# Patient Record
Sex: Male | Born: 1955 | Race: White | Hispanic: No | Smoking: Never smoker
Health system: Southern US, Community
[De-identification: ages and names within clinical notes are randomized; demographics above are authoritative.]

## PROBLEM LIST (undated history)

## (undated) DIAGNOSIS — E119 Type 2 diabetes mellitus without complications: Secondary | ICD-10-CM

## (undated) DIAGNOSIS — R55 Syncope and collapse: Secondary | ICD-10-CM

## (undated) HISTORY — PX: HERNIA REPAIR: SHX51

## (undated) HISTORY — DX: Syncope and collapse: R55

---
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
## 2010-06-19 ENCOUNTER — Emergency Department: Payer: Self-pay | Admitting: Emergency Medicine

## 2014-06-16 LAB — HEMOGLOBIN A1C: HEMOGLOBIN A1C: 6.9 % — AB (ref 4.0–6.0)

## 2014-07-07 DIAGNOSIS — J3489 Other specified disorders of nose and nasal sinuses: Secondary | ICD-10-CM | POA: Insufficient documentation

## 2014-07-07 DIAGNOSIS — N529 Male erectile dysfunction, unspecified: Secondary | ICD-10-CM | POA: Insufficient documentation

## 2014-07-07 DIAGNOSIS — L309 Dermatitis, unspecified: Secondary | ICD-10-CM | POA: Insufficient documentation

## 2014-07-07 DIAGNOSIS — Z87438 Personal history of other diseases of male genital organs: Secondary | ICD-10-CM | POA: Insufficient documentation

## 2014-07-07 DIAGNOSIS — E119 Type 2 diabetes mellitus without complications: Secondary | ICD-10-CM | POA: Insufficient documentation

## 2014-09-15 ENCOUNTER — Encounter: Payer: Self-pay | Admitting: Family Medicine

## 2014-09-15 ENCOUNTER — Ambulatory Visit (INDEPENDENT_AMBULATORY_CARE_PROVIDER_SITE_OTHER): Payer: BLUE CROSS/BLUE SHIELD | Admitting: Family Medicine

## 2014-09-15 VITALS — BP 120/70 | HR 60 | Temp 98.0°F | Resp 16 | Wt 191.6 lb

## 2014-09-15 DIAGNOSIS — E119 Type 2 diabetes mellitus without complications: Secondary | ICD-10-CM | POA: Diagnosis not present

## 2014-09-15 DIAGNOSIS — N529 Male erectile dysfunction, unspecified: Secondary | ICD-10-CM

## 2014-09-15 DIAGNOSIS — B351 Tinea unguium: Secondary | ICD-10-CM | POA: Diagnosis not present

## 2014-09-15 DIAGNOSIS — N528 Other male erectile dysfunction: Secondary | ICD-10-CM | POA: Diagnosis not present

## 2014-09-15 LAB — POCT GLYCOSYLATED HEMOGLOBIN (HGB A1C)

## 2014-09-15 MED ORDER — SILDENAFIL CITRATE 20 MG PO TABS: ORAL_TABLET | ORAL | Status: DC

## 2014-09-15 NOTE — Progress Notes (Signed)
Subjective:     Patient ID: Alejandro Marquez, male   DOB: 03/24/1955, 59 y.o.   MRN: [id number]  HPI  Chief Complaint  Patient presents with  . Diabetes    patient is present today for 3 month follow up. Last office visit was 06/16/14 HgbA1C was 6.9%, patient advised to continue Glipizide   States he is working in the heat at his jobs and has lost weight as a result. Reports labs this Spring at his workplace.   Review of Systems  Respiratory: Negative for shortness of breath.   Cardiovascular: Negative for chest pain and leg swelling.  Genitourinary:       Wishes to try generic sildenafil for his E.D.  Skin:       Wishes treatment for dystrophic right first toenail. Discussed otc medication. States he did see the dermatologist for removal of benign nasal lesion.       Objective:   Physical Exam  Constitutional: He appears well-developed and well-nourished. No distress.  Lungs: clear Heart: RRR without murmur Lower extremities: no edema; pedal pulses intact, sensation to monofilament intact, no wounds noted.     Assessment:    1.. Type 2 diabetes mellitus without complication - POCT glycosylated hemoglobin (Hb A1C)  2. ED (erectile dysfunction) of organic origin - sildenafil (REVATIO) 20 MG tablet; Take 2-3 tablets daily as needed for erection  Dispense: 12 tablet; Refill: 0  3. Onychomycosis of right great toe     Plan:    Discussed use of otc products for his toenail. Will f/u in 3 months. He is to acquire work labs for my review.

## 2014-09-15 NOTE — Patient Instructions (Signed)
Please obtain labs from your employee health for my review. Try Vick's Vapo Rub or [place] Tea Tree Oil for your nail for 3-6 months.

## 2014-12-20 ENCOUNTER — Other Ambulatory Visit: Payer: Self-pay | Admitting: Family Medicine

## 2014-12-21 ENCOUNTER — Ambulatory Visit (INDEPENDENT_AMBULATORY_CARE_PROVIDER_SITE_OTHER): Payer: BLUE CROSS/BLUE SHIELD | Admitting: Family Medicine

## 2014-12-21 ENCOUNTER — Encounter: Payer: Self-pay | Admitting: Family Medicine

## 2014-12-21 VITALS — BP 140/80 | HR 77 | Temp 98.7°F | Resp 16 | Wt 193.8 lb

## 2014-12-21 DIAGNOSIS — Z13220 Encounter for screening for lipoid disorders: Secondary | ICD-10-CM

## 2014-12-21 DIAGNOSIS — E119 Type 2 diabetes mellitus without complications: Secondary | ICD-10-CM

## 2014-12-21 LAB — POCT GLYCOSYLATED HEMOGLOBIN (HGB A1C): Hemoglobin A1C: 6.8

## 2014-12-21 NOTE — Progress Notes (Signed)
Subjective:     Patient ID: Alejandro BustleDwayne H Koch, male   DOB: [birth year], 57 y.o.   MRN: [id number]  HPI  Chief Complaint  Patient presents with  . Diabetes  States he continues to be careful with dietary choices and keeping his weight stable. Reports compliance with medication.Brings labs from work which reflect fingerstick values.   Review of Systems  Respiratory: Negative for shortness of breath.   Cardiovascular: Negative for chest pain and palpitations.       Objective:   Physical Exam  Constitutional: He appears well-developed and well-nourished.  Cardiovascular: Normal rate and regular rhythm.   Pulmonary/Chest: Breath sounds normal.  Musculoskeletal: He exhibits no edema (of lower extremities).       Assessment:    1. Type 2 diabetes mellitus without complication, without long-term current use of insulin (HCC) - POCT glycosylated hemoglobin (Hb A1C) - Comprehensive metabolic panel  2. Screening for hyperlipidemia - Lipid panel    Plan:    Further f/u pending lab work. If bp still borderline or elevated will start ACE inhibitor.

## 2014-12-21 NOTE — Patient Instructions (Signed)
We will call you with lab results.       

## 2015-01-01 ENCOUNTER — Telehealth: Payer: Self-pay

## 2015-01-01 LAB — LIPID PANEL
CHOLESTEROL TOTAL: 57 mg/dL — AB (ref 100–199)
Chol/HDL Ratio: 2 ratio units (ref 0.0–5.0)
HDL: 29 mg/dL — ABNORMAL LOW (ref >39)
LDL Calculated: 18 mg/dL (ref 0–99)
TRIGLYCERIDES: 49 mg/dL (ref 0–149)
VLDL Cholesterol Cal: 10 mg/dL (ref 5–40)

## 2015-01-01 LAB — COMPREHENSIVE METABOLIC PANEL
ALBUMIN: 4.2 g/dL (ref 3.5–5.5)
ALK PHOS: 65 IU/L (ref 39–117)
ALT: 44 IU/L (ref 0–44)
AST: 28 IU/L (ref 0–40)
Albumin/Globulin Ratio: 1.8 (ref 1.1–2.5)
BILIRUBIN TOTAL: 0.7 mg/dL (ref 0.0–1.2)
BUN / CREAT RATIO: 12 (ref 9–20)
BUN: 9 mg/dL (ref 6–24)
CHLORIDE: 101 mmol/L (ref 97–106)
CO2: 25 mmol/L (ref 18–29)
Calcium: 9.3 mg/dL (ref 8.7–10.2)
Creatinine, Ser: 0.78 mg/dL (ref 0.76–1.27)
GFR calc Af Amer: 116 mL/min/{1.73_m2} (ref >59)
GFR calc non Af Amer: 100 mL/min/{1.73_m2} (ref >59)
GLOBULIN, TOTAL: 2.4 g/dL (ref 1.5–4.5)
Glucose: 165 mg/dL — ABNORMAL HIGH (ref 65–99)
POTASSIUM: 3.9 mmol/L (ref 3.5–5.2)
SODIUM: 140 mmol/L (ref 136–144)
Total Protein: 6.6 g/dL (ref 6.0–8.5)

## 2015-01-01 NOTE — Telephone Encounter (Signed)
Patient has been advised of lab report. KW 

## 2015-01-01 NOTE — Telephone Encounter (Signed)
Pt is returning call.  CB#[phone]

## 2015-01-01 NOTE — Telephone Encounter (Signed)
-----   Message from Robert Anola Gurneyhauvin, [place] sent at 01/01/2015  7:40 AM EST ----- Your labs are ok. Kidney function and cholesterol are excellent.

## 2015-01-01 NOTE — Telephone Encounter (Signed)
LMTCB-KW 

## 2015-03-23 ENCOUNTER — Other Ambulatory Visit: Payer: Self-pay | Admitting: Family Medicine

## 2015-03-23 ENCOUNTER — Ambulatory Visit (INDEPENDENT_AMBULATORY_CARE_PROVIDER_SITE_OTHER): Payer: BLUE CROSS/BLUE SHIELD | Admitting: Family Medicine

## 2015-03-23 ENCOUNTER — Encounter: Payer: Self-pay | Admitting: Family Medicine

## 2015-03-23 VITALS — BP 118/76 | HR 80 | Temp 98.4°F | Resp 16 | Wt 199.2 lb

## 2015-03-23 DIAGNOSIS — E119 Type 2 diabetes mellitus without complications: Secondary | ICD-10-CM

## 2015-03-23 LAB — POCT GLYCOSYLATED HEMOGLOBIN (HGB A1C): Hemoglobin A1C: 8.6

## 2015-03-23 MED ORDER — METFORMIN HCL 500 MG PO TABS: 500.0000 mg | ORAL_TABLET | Freq: Two times a day (BID) | ORAL | Status: DC

## 2015-03-23 NOTE — Progress Notes (Signed)
Subjective:     Patient ID: Alejandro Marquez, male   DOB: [birth year], 60 y.o.   MRN: [id number]  HPI  Chief Complaint  Patient presents with  . Diabetes    Patient is present in office for 3 month follow up, patient last visit was 12/21/14 hemoglobin at visit was 6.8%. Patient reports good compliance, tolerance and symptom control on medication.  He has gained 6#.Attributes it to the winter weather and not being as active.   Review of Systems  Respiratory: Negative for shortness of breath.   Cardiovascular: Negative for chest pain and palpitations.       Objective:   Physical Exam  Constitutional: He appears well-developed and well-nourished. No distress.  Cardiovascular: Normal rate and regular rhythm.   Pulmonary/Chest: Breath sounds normal.  Musculoskeletal: He exhibits no edema (of lower extremities).       Assessment:    1. Type 2 diabetes mellitus without complication, without long-term current use of insulin (HCC) - POCT glycosylated hemoglobin (Hb A1C) - metFORMIN (GLUCOPHAGE) 500 MG tablet; Take 1 tablet (500 mg total) by mouth 2 (two) times daily with a meal.  Dispense: 180 tablet; Refill: 0 - Microalbumin / creatinine urine ratio    Plan:    Discussed continuing glipizide and exercising for 30 minutes daily.

## 2015-03-23 NOTE — Patient Instructions (Signed)
Work on exercise 30 minutes daily.

## 2015-03-25 LAB — MICROALBUMIN / CREATININE URINE RATIO
Creatinine, Urine: 104.7 mg/dL
MICROALB/CREAT RATIO: 4 mg/g creat (ref 0.0–30.0)
Microalbumin, Urine: 4.2 ug/mL

## 2015-03-26 ENCOUNTER — Telehealth: Payer: Self-pay

## 2015-03-26 NOTE — Telephone Encounter (Signed)
LMTCB-KW 

## 2015-03-26 NOTE — Telephone Encounter (Signed)
Patient was advised. KW 

## 2015-03-26 NOTE — Telephone Encounter (Signed)
-----   Message from Anola Gurney, [name] sent at 03/26/2015  7:43 AM EST ----- Urine test ok-no abnormal elevation of protein.

## 2015-03-26 NOTE — Telephone Encounter (Signed)
Pt returned your call.   ° °Thanks teri  °

## 2015-06-09 ENCOUNTER — Other Ambulatory Visit: Payer: Self-pay | Admitting: Family Medicine

## 2015-06-14 ENCOUNTER — Other Ambulatory Visit: Payer: Self-pay | Admitting: Family Medicine

## 2015-06-18 ENCOUNTER — Encounter: Payer: Self-pay | Admitting: Family Medicine

## 2015-06-18 ENCOUNTER — Ambulatory Visit (INDEPENDENT_AMBULATORY_CARE_PROVIDER_SITE_OTHER): Payer: BLUE CROSS/BLUE SHIELD | Admitting: Family Medicine

## 2015-06-18 VITALS — BP 114/74 | HR 64 | Temp 98.8°F | Resp 16 | Wt 194.0 lb

## 2015-06-18 DIAGNOSIS — E119 Type 2 diabetes mellitus without complications: Secondary | ICD-10-CM | POA: Diagnosis not present

## 2015-06-18 LAB — POCT GLYCOSYLATED HEMOGLOBIN (HGB A1C)

## 2015-06-18 NOTE — Progress Notes (Signed)
Subjective:     Patient ID: Alejandro Marquez, male   DOB: Jul 07, 1955, 60 y.o.   MRN: [id number]  HPI  Chief Complaint  Patient presents with  . Diabetes    Patient returns to office today for 3 month follow up. Last office visit was 03/23/15, HgbA1C in offive was 8.6%. Patient was advised to continue Metformn 500mg  BID and Glipizide. Patient reports good compliance and symptom control.   He has also lost 5# since last office visit. Continues to work two jobs.   Review of Systems  Respiratory: Negative for shortness of breath.   Cardiovascular: Negative for chest pain and palpitations.  Gastrointestinal:       Defers colonoscopy       Objective:   Physical Exam  Constitutional: He appears well-developed and well-nourished. No distress.  Cardiovascular: Normal rate and regular rhythm.   Pulmonary/Chest: Breath sounds normal.  Musculoskeletal: He exhibits no edema (of lower extremities).       Assessment:    1. Type 2 diabetes mellitus without complication, without long-term current use of insulin (HCC): controlled - POCT glycosylated hemoglobin (Hb A1C)    Plan:    Encouraged further weight loss. Discussed he may back down to 1/2 glipizide twice daily if he has low sugar spells

## 2015-06-18 NOTE — Patient Instructions (Signed)
Continue both medications for diabetes. Continue to stay active and see if another 5-10% weight loss will allow us to take off medication.

## 2015-09-04 ENCOUNTER — Other Ambulatory Visit: Payer: Self-pay | Admitting: Family Medicine

## 2015-09-09 ENCOUNTER — Other Ambulatory Visit: Payer: Self-pay | Admitting: Family Medicine

## 2015-09-17 ENCOUNTER — Encounter: Payer: Self-pay | Admitting: Family Medicine

## 2015-09-17 ENCOUNTER — Ambulatory Visit (INDEPENDENT_AMBULATORY_CARE_PROVIDER_SITE_OTHER): Payer: BLUE CROSS/BLUE SHIELD | Admitting: Family Medicine

## 2015-09-17 VITALS — BP 122/80 | HR 68 | Temp 98.3°F | Wt 193.2 lb

## 2015-09-17 DIAGNOSIS — E119 Type 2 diabetes mellitus without complications: Secondary | ICD-10-CM | POA: Diagnosis not present

## 2015-09-17 LAB — POCT GLYCOSYLATED HEMOGLOBIN (HGB A1C)

## 2015-09-17 NOTE — Progress Notes (Signed)
Subjective:     Patient ID: Alejandro Marquez, male   DOB: [birth year], 60 y.o.   MRN: [id number]  HPI  Chief Complaint  Patient presents with  . Diabetes    Patient comes into office today for 3 month follow up visit, last offive visit was 06/18/15 condition was stable. Patient last reported HgbA1C was 6.9%, patient reports that he is not checking his blood sugar at home. He reports good compliance and tolerance on medication.      Review of Systems  Respiratory: Negative for shortness of breath.   Cardiovascular: Negative for chest pain and palpitations.  Skin: Positive for rash.       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 Has used corn starch with minimal improvement.       Objective:   Physical Exam  Constitutional: He appears well-developed and well-nourished. No distress.  Lungs: clear Heart: RRR without murmur Lower extremities: no edema; pedal pulses intact, sensation to monofilament intact, no wounds noted.     Assessment:    1. Type 2 diabetes mellitus without complication, without long-term current use of insulin (HCC) - POCT glycosylated hemoglobin (Hb A1C)    Plan:    Discussed use of Tinactin powder or similar. Will continue his current exercise and food choices and not increase medication at this time. Recheck labs next o.v.

## 2015-09-17 NOTE — Patient Instructions (Addendum)
Continue regular exercise and wise food choices. We will update your labs at the next office visit. Discussed use of antifungal powder 1-2 x day for groin rash.

## 2015-09-21 DIAGNOSIS — E119 Type 2 diabetes mellitus without complications: Secondary | ICD-10-CM | POA: Diagnosis not present

## 2015-09-21 DIAGNOSIS — H524 Presbyopia: Secondary | ICD-10-CM | POA: Diagnosis not present

## 2015-11-27 ENCOUNTER — Other Ambulatory Visit: Payer: Self-pay | Admitting: Family Medicine

## 2015-12-01 NOTE — Telephone Encounter (Signed)
Provider is out of office can you please review over request? Thank you. KW 

## 2015-12-02 ENCOUNTER — Other Ambulatory Visit: Payer: Self-pay | Admitting: Family Medicine

## 2015-12-03 NOTE — Telephone Encounter (Signed)
Provider is out of office can you please review? Thanks KW 

## 2015-12-03 NOTE — Telephone Encounter (Signed)
Will send refill to the pharmacy. Remind patient to keep appointment scheduled for 12-20-15 to follow up on diabetes.

## 2015-12-08 ENCOUNTER — Other Ambulatory Visit: Payer: Self-pay | Admitting: Family Medicine

## 2015-12-08 DIAGNOSIS — Z13220 Encounter for screening for lipoid disorders: Secondary | ICD-10-CM

## 2015-12-08 DIAGNOSIS — E119 Type 2 diabetes mellitus without complications: Secondary | ICD-10-CM | POA: Diagnosis not present

## 2015-12-09 ENCOUNTER — Telehealth: Payer: Self-pay

## 2015-12-09 LAB — COMPREHENSIVE METABOLIC PANEL
ALK PHOS: 69 IU/L (ref 39–117)
ALT: 69 IU/L — ABNORMAL HIGH (ref 0–44)
AST: 52 IU/L — AB (ref 0–40)
Albumin/Globulin Ratio: 1.5 (ref 1.2–2.2)
Albumin: 4.3 g/dL (ref 3.5–5.5)
BUN/Creatinine Ratio: 13 (ref 9–20)
BUN: 11 mg/dL (ref 6–24)
Bilirubin Total: 0.9 mg/dL (ref 0.0–1.2)
CO2: 27 mmol/L (ref 18–29)
CREATININE: 0.85 mg/dL (ref 0.76–1.27)
Calcium: 9.4 mg/dL (ref 8.7–10.2)
Chloride: 99 mmol/L (ref 96–106)
GFR calc Af Amer: 111 mL/min/{1.73_m2} (ref >59)
GFR calc non Af Amer: 96 mL/min/{1.73_m2} (ref >59)
GLOBULIN, TOTAL: 2.9 g/dL (ref 1.5–4.5)
Glucose: 172 mg/dL — ABNORMAL HIGH (ref 65–99)
POTASSIUM: 4.6 mmol/L (ref 3.5–5.2)
SODIUM: 141 mmol/L (ref 134–144)
Total Protein: 7.2 g/dL (ref 6.0–8.5)

## 2015-12-09 LAB — LIPID PANEL
CHOLESTEROL TOTAL: 63 mg/dL — AB (ref 100–199)
Chol/HDL Ratio: 2 ratio units (ref 0.0–5.0)
HDL: 31 mg/dL — ABNORMAL LOW (ref >39)
LDL CALC: 23 mg/dL (ref 0–99)
TRIGLYCERIDES: 44 mg/dL (ref 0–149)
VLDL Cholesterol Cal: 9 mg/dL (ref 5–40)

## 2015-12-09 NOTE — Telephone Encounter (Signed)
Patient was advised he states that he believes he might have ate something that day that might be cause for elevated glucose. KW

## 2015-12-09 NOTE — Telephone Encounter (Signed)
LMTCB-KW 

## 2015-12-09 NOTE — Telephone Encounter (Signed)
-----   Message from Anola Gurneyobert Chauvin, [place] sent at 12/09/2015  7:44 AM EDT ----- Cholesterol remains low. Sugar is elevated (were these fasting labs?) and two of your liver tests are mildly elevated. Will discuss further at your upcoming office visit.

## 2015-12-20 ENCOUNTER — Encounter: Payer: Self-pay | Admitting: Family Medicine

## 2015-12-20 ENCOUNTER — Ambulatory Visit (INDEPENDENT_AMBULATORY_CARE_PROVIDER_SITE_OTHER): Payer: BLUE CROSS/BLUE SHIELD | Admitting: Family Medicine

## 2015-12-20 VITALS — BP 110/76 | HR 80 | Temp 98.6°F | Resp 16 | Wt 197.0 lb

## 2015-12-20 DIAGNOSIS — R7989 Other specified abnormal findings of blood chemistry: Secondary | ICD-10-CM

## 2015-12-20 DIAGNOSIS — R945 Abnormal results of liver function studies: Secondary | ICD-10-CM

## 2015-12-20 DIAGNOSIS — E119 Type 2 diabetes mellitus without complications: Secondary | ICD-10-CM

## 2015-12-20 LAB — POCT GLYCOSYLATED HEMOGLOBIN (HGB A1C): Hemoglobin A1C: 7.7

## 2015-12-20 MED ORDER — GLIPIZIDE 10 MG PO TABS: 10.0000 mg | ORAL_TABLET | Freq: Two times a day (BID) | ORAL | 1 refills | Status: DC

## 2015-12-20 NOTE — Progress Notes (Signed)
Subjective:     Patient ID: Alejandro Marquez, male   DOB: [birth year], 60 y.o.   MRN: [id number]  HPI  Chief Complaint  Patient presents with  . Diabetes    Patient comes in office today afor 3 month follow up, last office visit was 09/17/15 HgbA1C was 7.4%. At last office visit patient was encouraged to exercise and watch food choices. Patient reports that he does not exercise but is active between his two jobs.   Mildly elevated transaminases on his latest met profile. Reports remote history of fatty liver after G.I.evaluation at [facility] per his report. Defers flu and pneumovax vaccines: "I don't like shots."   Review of Systems  Respiratory: Negative for shortness of breath.   Cardiovascular: Negative for chest pain and palpitations.       Objective:   Physical Exam  Constitutional: He appears well-developed and well-nourished. No distress.  Cardiovascular: Normal rate and regular rhythm.   Pulmonary/Chest: Breath sounds normal.  Musculoskeletal: He exhibits no edema (of lower extremities).       Assessment:    1. Type 2 diabetes mellitus without complication, without long-term current use of insulin ([place]): will increase glipizide, continue metformin (can't tolerate higher dose due to diarrhea). - POCT glycosylated hemoglobin (Hb A1C) - glipiZIDE (GLUCOTROL) 10 MG tablet; Take 1 tablet (10 mg total) by mouth 2 (two) times daily before a meal.  Dispense: 180 tablet; Refill: 1  2. Elevated liver function tests - Hepatic function panel    Plan:   Further f/u pending lab work and in 3 months.

## 2015-12-20 NOTE — Patient Instructions (Signed)
Please get your liver tests. And we will call you with the results. Increase your glipizide to 10 mg.

## 2015-12-23 DIAGNOSIS — R7989 Other specified abnormal findings of blood chemistry: Secondary | ICD-10-CM | POA: Diagnosis not present

## 2015-12-24 ENCOUNTER — Telehealth: Payer: Self-pay

## 2015-12-24 LAB — HEPATIC FUNCTION PANEL
ALBUMIN: 4 g/dL (ref 3.5–5.5)
ALT: 73 IU/L — ABNORMAL HIGH (ref 0–44)
AST: 54 IU/L — ABNORMAL HIGH (ref 0–40)
Alkaline Phosphatase: 64 IU/L (ref 39–117)
BILIRUBIN TOTAL: 1.1 mg/dL (ref 0.0–1.2)
BILIRUBIN, DIRECT: 0.35 mg/dL (ref 0.00–0.40)
Total Protein: 7 g/dL (ref 6.0–8.5)

## 2015-12-24 NOTE — Telephone Encounter (Signed)
lmtcb Emily Drozdowski, CMA  

## 2015-12-24 NOTE — Telephone Encounter (Signed)
Patient has been advised he states that he will recheck again in 3months.KW

## 2015-12-24 NOTE — Telephone Encounter (Signed)
-----   Message from Anola Gurneyobert Chauvin, [place] sent at 12/24/2015  7:36 AM EDT ----- Liver tests about the same. Do you wish further evaluation or just recheck labs in 3 months?

## 2015-12-24 NOTE — Telephone Encounter (Signed)
LMTCB-KW 

## 2016-02-27 ENCOUNTER — Other Ambulatory Visit: Payer: Self-pay | Admitting: Family Medicine

## 2016-03-01 DIAGNOSIS — L718 Other rosacea: Secondary | ICD-10-CM | POA: Diagnosis not present

## 2016-03-21 ENCOUNTER — Ambulatory Visit (INDEPENDENT_AMBULATORY_CARE_PROVIDER_SITE_OTHER): Payer: BLUE CROSS/BLUE SHIELD | Admitting: Family Medicine

## 2016-03-21 ENCOUNTER — Encounter: Payer: Self-pay | Admitting: Family Medicine

## 2016-03-21 VITALS — BP 110/70 | HR 80 | Temp 98.8°F | Resp 16 | Wt 197.4 lb

## 2016-03-21 DIAGNOSIS — R740 Nonspecific elevation of levels of transaminase and lactic acid dehydrogenase [LDH]: Secondary | ICD-10-CM | POA: Diagnosis not present

## 2016-03-21 DIAGNOSIS — E119 Type 2 diabetes mellitus without complications: Secondary | ICD-10-CM | POA: Diagnosis not present

## 2016-03-21 DIAGNOSIS — IMO0002 Reserved for concepts with insufficient information to code with codable children: Secondary | ICD-10-CM

## 2016-03-21 LAB — POCT GLYCOSYLATED HEMOGLOBIN (HGB A1C)

## 2016-03-21 MED ORDER — METFORMIN HCL 1000 MG PO TABS: 1000.0000 mg | ORAL_TABLET | Freq: Two times a day (BID) | ORAL | 3 refills | Status: DC

## 2016-03-21 NOTE — Patient Instructions (Signed)
We will call you with the lab results. Let me know if you can't tolerate the higher dose of metformin

## 2016-03-21 NOTE — Progress Notes (Signed)
Subjective:     Patient ID: Alejandro Marquez, male   DOB: September 23, 1955, 61 y.o.   MRN: [id number]  HPI  Chief Complaint  Patient presents with  . Diabetes    Patient comes in office today for 3 month follow up, last office visit was 12/20/15 we increased Glipizide to 10 mg and advised patient to continue Metformin. Patients HgbA1C at visit was 7.7%, patient reports good compliance and tolerance on medication since being increased.   . Abnormal Lab    At last office visit 12/20/15 patient was given order for Hepatic Function Panel, results came back showing elevation of  AST, ALT.   Continues to work two jobs.   Review of Systems  Respiratory: Negative for shortness of breath.   Cardiovascular: Negative for chest pain and palpitations.       Objective:   Physical Exam  Constitutional: He appears well-developed and well-nourished. No distress.  Cardiovascular: Normal rate and regular rhythm.   Pulmonary/Chest: Breath sounds normal.  Musculoskeletal: He exhibits no edema (of lower extremities).       Assessment:    1. Type 2 diabetes mellitus without complication, without long-term current use of insulin (HCC): will increase medication for better control - POCT glycosylated hemoglobin (Hb A1C) - metFORMIN (GLUCOPHAGE) 1000 MG tablet; Take 1 tablet (1,000 mg total) by mouth 2 (two) times daily with a meal.  Dispense: 180 tablet; Refill: 3  2. Transaminase or LDH elevation - Hepatic function panel    Plan:   Further f/u pending lab work

## 2016-05-05 DIAGNOSIS — R740 Nonspecific elevation of levels of transaminase and lactic acid dehydrogenase [LDH]: Secondary | ICD-10-CM | POA: Diagnosis not present

## 2016-05-06 LAB — HEPATIC FUNCTION PANEL
ALK PHOS: 83 IU/L (ref 39–117)
ALT: 70 IU/L — AB (ref 0–44)
AST: 43 IU/L — ABNORMAL HIGH (ref 0–40)
Albumin: 4.2 g/dL (ref 3.5–5.5)
BILIRUBIN, DIRECT: 0.14 mg/dL (ref 0.00–0.40)
Bilirubin Total: 0.4 mg/dL (ref 0.0–1.2)
Total Protein: 7.1 g/dL (ref 6.0–8.5)

## 2016-05-08 ENCOUNTER — Telehealth: Payer: Self-pay

## 2016-05-08 NOTE — Telephone Encounter (Signed)
-----   Message from Anola Gurneyobert Chauvin, [place] sent at 05/08/2016  7:59 AM EDT ----- Lever tests mildly improved. Will  Discuss further at next office visit.

## 2016-05-08 NOTE — Telephone Encounter (Signed)
Patient has been advised. KW 

## 2016-05-08 NOTE — Telephone Encounter (Signed)
LMTCB  Thanks,  -Joseline 

## 2016-06-07 ENCOUNTER — Other Ambulatory Visit: Payer: Self-pay | Admitting: Family Medicine

## 2016-06-07 DIAGNOSIS — E119 Type 2 diabetes mellitus without complications: Secondary | ICD-10-CM

## 2016-06-19 ENCOUNTER — Ambulatory Visit (INDEPENDENT_AMBULATORY_CARE_PROVIDER_SITE_OTHER): Payer: BLUE CROSS/BLUE SHIELD | Admitting: Family Medicine

## 2016-06-19 ENCOUNTER — Encounter: Payer: Self-pay | Admitting: Family Medicine

## 2016-06-19 VITALS — BP 120/84 | HR 92 | Temp 98.5°F | Resp 16 | Wt 192.6 lb

## 2016-06-19 DIAGNOSIS — R7401 Elevation of levels of liver transaminase levels: Secondary | ICD-10-CM | POA: Insufficient documentation

## 2016-06-19 DIAGNOSIS — R740 Nonspecific elevation of levels of transaminase and lactic acid dehydrogenase [LDH]: Secondary | ICD-10-CM | POA: Diagnosis not present

## 2016-06-19 DIAGNOSIS — E119 Type 2 diabetes mellitus without complications: Secondary | ICD-10-CM

## 2016-06-19 LAB — POCT GLYCOSYLATED HEMOGLOBIN (HGB A1C): Hemoglobin A1C: 6

## 2016-06-19 NOTE — Patient Instructions (Addendum)
We will call you with the results of the test for urine protein. We will check your liver tests again at the next visit.

## 2016-06-19 NOTE — Progress Notes (Signed)
Subjective:     Patient ID: Alejandro Marquez, male   DOB: Oct 24, 1955, 61 y.o.   MRN: [id number]  HPI  Chief Complaint  Patient presents with  . Diabetes    Patient comes in office today for three visit, last visit was 03/21/16 we had increased patients Metformin to  twice a day to control diabetes. Patient reports good compliance and tolerance on medication, patient states that he has had 1-2 hyperglycemia incidents since last visit.   Reports that he gets his eye exam annually in August with Dr. Alexia Freestone. Continues to work two jobs and his weight is down 5 # from prior visit.   Review of Systems  Respiratory: Negative for shortness of breath.   Cardiovascular: Negative for chest pain and palpitations.       Objective:   Physical Exam  Constitutional: He appears well-developed and well-nourished. No distress.  Lungs: clear Heart: RRR without murmur Lower extremities: no edema; pedal pulses intact, sensation to monofilament intact, no wounds noted.     Assessment:    1. Type 2 diabetes mellitus without complication, without long-term current use of insulin (HCC) - POCT glycosylated hemoglobin (Hb A1C) - Microalbumin / creatinine urine ratio  2. Elevated transaminase level: recheck at next o.v./Consider ultrasound    Plan:    Further f/u pending lab results.

## 2016-06-20 ENCOUNTER — Telehealth: Payer: Self-pay

## 2016-06-20 LAB — MICROALBUMIN / CREATININE URINE RATIO
CREATININE, UR: 155.4 mg/dL
MICROALB/CREAT RATIO: 3.9 mg/g{creat} (ref 0.0–30.0)
MICROALBUM., U, RANDOM: 6.1 ug/mL

## 2016-06-20 NOTE — Telephone Encounter (Signed)
Patient has been advised. KW 

## 2016-06-20 NOTE — Telephone Encounter (Signed)
-----   Message from Anola Gurney, [name] sent at 06/20/2016  1:46 PM EDT ----- Urine ok

## 2016-09-18 ENCOUNTER — Encounter: Payer: Self-pay | Admitting: Family Medicine

## 2016-09-18 ENCOUNTER — Ambulatory Visit (INDEPENDENT_AMBULATORY_CARE_PROVIDER_SITE_OTHER): Payer: BLUE CROSS/BLUE SHIELD | Admitting: Family Medicine

## 2016-09-18 VITALS — BP 150/100 | HR 83 | Temp 98.8°F | Resp 16 | Wt 190.8 lb

## 2016-09-18 DIAGNOSIS — E119 Type 2 diabetes mellitus without complications: Secondary | ICD-10-CM | POA: Diagnosis not present

## 2016-09-18 DIAGNOSIS — R7401 Elevation of levels of liver transaminase levels: Secondary | ICD-10-CM

## 2016-09-18 DIAGNOSIS — R740 Nonspecific elevation of levels of transaminase and lactic acid dehydrogenase [LDH]: Secondary | ICD-10-CM | POA: Diagnosis not present

## 2016-09-18 LAB — POCT GLYCOSYLATED HEMOGLOBIN (HGB A1C): HEMOGLOBIN A1C: 6.3

## 2016-09-18 NOTE — Patient Instructions (Signed)
Nurse bp check in a week or so. We will call you with the lab results.

## 2016-09-18 NOTE — Progress Notes (Signed)
Subjective:     Patient ID: Alejandro Marquez, male   DOB: [birth year], 61 y.o.   MRN: [id number]  HPI  Chief Complaint  Patient presents with  . Diabetes    Patient returns back to office today for 3 month follow up, last office visit was 6.0%. Patient reports good compliance and symptom control on medication.   States he ate [place] sausages prior to his office visit and feels that may have contributed to his blood pressure elevation.   Review of Systems  Respiratory: Negative for shortness of breath.   Cardiovascular: Negative for chest pain and palpitations.       Objective:   Physical Exam  Constitutional: He appears well-developed and well-nourished. No distress.  Cardiovascular: Normal rate and regular rhythm.   Pulmonary/Chest: Breath sounds normal.  Musculoskeletal: He exhibits no edema (of lower extremities).       Assessment:    1. Type 2 diabetes mellitus without complication, without long-term current use of insulin (HCC) - POCT glycosylated hemoglobin (Hb A1C)  2. Elevated transaminase level - Hepatic function panel - Hepatitis C Antibody    Plan:    Nurse bp check in a week. Further f/u pending lab work. Consider abdominal ultrasound.

## 2016-09-19 ENCOUNTER — Telehealth: Payer: Self-pay

## 2016-09-19 LAB — HEPATITIS C ANTIBODY

## 2016-09-19 LAB — HEPATIC FUNCTION PANEL
ALT: 57 IU/L — ABNORMAL HIGH (ref 0–44)
AST: 39 IU/L (ref 0–40)
Albumin: 4.3 g/dL (ref 3.5–5.5)
Alkaline Phosphatase: 66 IU/L (ref 39–117)
Bilirubin Total: 0.7 mg/dL (ref 0.0–1.2)
Bilirubin, Direct: 0.31 mg/dL (ref 0.00–0.40)
Total Protein: 7.1 g/dL (ref 6.0–8.5)

## 2016-09-19 NOTE — Telephone Encounter (Signed)
lmtcb

## 2016-09-19 NOTE — Telephone Encounter (Signed)
-----   Message from Anola Gurneyobert Chauvin, [place] sent at 09/19/2016  7:31 AM EDT ----- Hepatitis C ok; one liver test is now normal the other is near normal.

## 2016-09-20 NOTE — Telephone Encounter (Signed)
Pt advised.

## 2016-09-21 DIAGNOSIS — E119 Type 2 diabetes mellitus without complications: Secondary | ICD-10-CM | POA: Diagnosis not present

## 2016-09-21 DIAGNOSIS — H524 Presbyopia: Secondary | ICD-10-CM | POA: Diagnosis not present

## 2016-12-05 ENCOUNTER — Other Ambulatory Visit: Payer: Self-pay | Admitting: Family Medicine

## 2016-12-05 DIAGNOSIS — E119 Type 2 diabetes mellitus without complications: Secondary | ICD-10-CM

## 2016-12-19 ENCOUNTER — Ambulatory Visit (INDEPENDENT_AMBULATORY_CARE_PROVIDER_SITE_OTHER): Payer: BLUE CROSS/BLUE SHIELD | Admitting: Family Medicine

## 2016-12-19 ENCOUNTER — Encounter: Payer: Self-pay | Admitting: Family Medicine

## 2016-12-19 VITALS — BP 124/82 | HR 75 | Temp 98.5°F | Resp 16 | Wt 192.6 lb

## 2016-12-19 DIAGNOSIS — E119 Type 2 diabetes mellitus without complications: Secondary | ICD-10-CM | POA: Diagnosis not present

## 2016-12-19 LAB — POCT GLYCOSYLATED HEMOGLOBIN (HGB A1C): Hemoglobin A1C: 6.4

## 2016-12-19 NOTE — Progress Notes (Signed)
Subjective:     Patient ID: Alejandro Marquez, male   DOB: 1955/05/16, [age] y.o.   MRN: [id number]  HPI  Chief Complaint  Patient presents with  . Diabetes    Patient comes in office today for 3 month follow up, last office visit was 09/18/16 HgbA1C was 6.3%. Patient reports good compliance, tolerance and symptom control on medication.   Continues to work two jobs. Continues to not be interested in colonoscopy or vaccines.    Review of Systems  Respiratory: Negative for shortness of breath.   Cardiovascular: Negative for chest pain and palpitations.       Objective:   Physical Exam  Constitutional: He appears well-developed and well-nourished. No distress.  Cardiovascular: Normal rate and regular rhythm.   Pulmonary/Chest: Breath sounds normal.  Musculoskeletal: He exhibits no edema (of lower extremities).       Assessment:    1. Type 2 diabetes mellitus without complication, without long-term current use of insulin (HCC) - POCT glycosylated hemoglobin (Hb A1C) - COMPLETE METABOLIC PANEL WITH GFR - Lipid panel    Plan:    Further f/u pending lab results.

## 2016-12-19 NOTE — Patient Instructions (Signed)
We will call you with the lab results. 

## 2017-01-12 DIAGNOSIS — E119 Type 2 diabetes mellitus without complications: Secondary | ICD-10-CM | POA: Diagnosis not present

## 2017-01-12 LAB — COMPLETE METABOLIC PANEL WITH GFR
AG Ratio: 1.5 (calc) (ref 1.0–2.5)
ALT: 61 U/L — AB (ref 9–46)
AST: 38 U/L — ABNORMAL HIGH (ref 10–35)
Albumin: 4 g/dL (ref 3.6–5.1)
Alkaline phosphatase (APISO): 56 U/L (ref 40–115)
BUN: 11 mg/dL (ref 7–25)
CALCIUM: 8.8 mg/dL (ref 8.6–10.3)
CO2: 29 mmol/L (ref 20–32)
CREATININE: 0.83 mg/dL (ref 0.70–1.33)
Chloride: 105 mmol/L (ref 98–110)
GFR, EST NON AFRICAN AMERICAN: 96 mL/min/{1.73_m2} (ref >=60)
GFR, Est African American: 112 mL/min/{1.73_m2} (ref >=60)
GLOBULIN: 2.7 g/dL (ref 1.9–3.7)
GLUCOSE: 195 mg/dL — AB (ref 65–99)
Potassium: 4.3 mmol/L (ref 3.5–5.3)
Sodium: 138 mmol/L (ref 135–146)
Total Bilirubin: 0.6 mg/dL (ref 0.2–1.2)
Total Protein: 6.7 g/dL (ref 6.1–8.1)

## 2017-01-12 LAB — LIPID PANEL
Cholesterol: 63 mg/dL (ref <200)
HDL: 33 mg/dL — ABNORMAL LOW (ref >40)
LDL CHOLESTEROL (CALC): 16 mg/dL
NON-HDL CHOLESTEROL (CALC): 30 mg/dL (ref <130)
TRIGLYCERIDES: 62 mg/dL (ref <150)
Total CHOL/HDL Ratio: 1.9 (calc) (ref <5.0)

## 2017-01-15 ENCOUNTER — Telehealth: Payer: Self-pay

## 2017-01-15 NOTE — Telephone Encounter (Signed)
Left message advising pt.  (Okay per DPR.)   Thanks,   -Laura  

## 2017-01-15 NOTE — Telephone Encounter (Signed)
-----   Message from Anola Gurneyobert Chauvin, [place] sent at 01/15/2017  7:36 AM EST ----- Labs ok. Minimally elevated liver tests.

## 2017-02-28 DIAGNOSIS — D1801 Hemangioma of skin and subcutaneous tissue: Secondary | ICD-10-CM | POA: Diagnosis not present

## 2017-02-28 DIAGNOSIS — L718 Other rosacea: Secondary | ICD-10-CM | POA: Diagnosis not present

## 2017-02-28 DIAGNOSIS — D485 Neoplasm of uncertain behavior of skin: Secondary | ICD-10-CM | POA: Diagnosis not present

## 2017-03-14 ENCOUNTER — Other Ambulatory Visit: Payer: Self-pay | Admitting: Family Medicine

## 2017-03-14 DIAGNOSIS — E119 Type 2 diabetes mellitus without complications: Secondary | ICD-10-CM

## 2017-03-22 ENCOUNTER — Encounter: Payer: Self-pay | Admitting: Family Medicine

## 2017-03-22 ENCOUNTER — Ambulatory Visit: Payer: BLUE CROSS/BLUE SHIELD | Admitting: Family Medicine

## 2017-03-22 VITALS — BP 150/94 | HR 68 | Temp 98.4°F | Resp 16 | Wt 197.0 lb

## 2017-03-22 DIAGNOSIS — E119 Type 2 diabetes mellitus without complications: Secondary | ICD-10-CM

## 2017-03-22 DIAGNOSIS — R030 Elevated blood-pressure reading, without diagnosis of hypertension: Secondary | ICD-10-CM | POA: Diagnosis not present

## 2017-03-22 LAB — POCT GLYCOSYLATED HEMOGLOBIN (HGB A1C): Hemoglobin A1C: 6.9

## 2017-03-22 NOTE — Progress Notes (Signed)
Subjective:     Patient ID: Alejandro Marquez, male   DOB: 04/18/1955, 62 y.o.   MRN: [id number] Chief Complaint  Patient presents with  . Diabetes    Patient comes in office today for 3 month follow up, patient HgbA1C at last visit was 6.4%. Patient only reports of one hypoglycemia episode since last visit which caused him to be shaky and feel light headed. Patient reports good compliance and tolerance on medication.    HPI States he does not get as many hours during the winter in his second job at [employer]. He attributes 5# weight gain to this.  Review of Systems  Respiratory: Negative for shortness of breath.   Cardiovascular: Negative for chest pain and palpitations.       Objective:   Physical Exam  Constitutional: He appears well-developed and well-nourished. No distress.  Cardiovascular: Normal rate and regular rhythm.  Pulmonary/Chest: Breath sounds normal.  Musculoskeletal: He exhibits no edema (of lower extremities).       Assessment:    1. Type 2 diabetes mellitus without complication, without long-term current use of insulin (HCC) - POCT glycosylated hemoglobin (Hb A1C)  2. Blood pressure elevated without history of HTN    Plan:    Nurse bp check in one-two weeks. Urine and sensation check at next visit in 3 months.

## 2017-03-22 NOTE — Patient Instructions (Signed)
Please come in for a nurse bp check in the next week or two. Please call for an appointment in 3 months

## 2017-05-26 ENCOUNTER — Other Ambulatory Visit: Payer: Self-pay | Admitting: Family Medicine

## 2017-05-26 DIAGNOSIS — E119 Type 2 diabetes mellitus without complications: Secondary | ICD-10-CM

## 2017-06-13 ENCOUNTER — Emergency Department: Payer: BLUE CROSS/BLUE SHIELD

## 2017-06-13 ENCOUNTER — Emergency Department
Admission: EM | Admit: 2017-06-13 | Discharge: 2017-06-13 | Disposition: A | Discharge status: Home / Self Care | Payer: BLUE CROSS/BLUE SHIELD | Attending: Emergency Medicine | Admitting: Emergency Medicine

## 2017-06-13 ENCOUNTER — Other Ambulatory Visit: Payer: Self-pay

## 2017-06-13 ENCOUNTER — Encounter: Payer: Self-pay | Admitting: Emergency Medicine

## 2017-06-13 DIAGNOSIS — L299 Pruritus, unspecified: Secondary | ICD-10-CM | POA: Diagnosis present

## 2017-06-13 DIAGNOSIS — W01198A Fall on same level from slipping, tripping and stumbling with subsequent striking against other object, initial encounter: Secondary | ICD-10-CM | POA: Diagnosis not present

## 2017-06-13 DIAGNOSIS — R55 Syncope and collapse: Secondary | ICD-10-CM | POA: Insufficient documentation

## 2017-06-13 DIAGNOSIS — R079 Chest pain, unspecified: Secondary | ICD-10-CM | POA: Diagnosis not present

## 2017-06-13 DIAGNOSIS — S299XXA Unspecified injury of thorax, initial encounter: Secondary | ICD-10-CM | POA: Diagnosis not present

## 2017-06-13 DIAGNOSIS — E119 Type 2 diabetes mellitus without complications: Secondary | ICD-10-CM | POA: Diagnosis not present

## 2017-06-13 DIAGNOSIS — T782XXA Anaphylactic shock, unspecified, initial encounter: Secondary | ICD-10-CM | POA: Insufficient documentation

## 2017-06-13 DIAGNOSIS — S0990XA Unspecified injury of head, initial encounter: Secondary | ICD-10-CM | POA: Diagnosis not present

## 2017-06-13 HISTORY — DX: Type 2 diabetes mellitus without complications: E11.9

## 2017-06-13 LAB — COMPREHENSIVE METABOLIC PANEL
ALK PHOS: 66 U/L (ref 38–126)
ALT: 77 U/L — AB (ref 17–63)
AST: 63 U/L — AB (ref 15–41)
Albumin: 4.1 g/dL (ref 3.5–5.0)
Anion gap: 11 (ref 5–15)
BUN: 14 mg/dL (ref 6–20)
CALCIUM: 9.4 mg/dL (ref 8.9–10.3)
CHLORIDE: 102 mmol/L (ref 101–111)
CO2: 25 mmol/L (ref 22–32)
CREATININE: 1.13 mg/dL (ref 0.61–1.24)
GFR calc non Af Amer: >60 mL/min (ref >60)
GLUCOSE: 322 mg/dL — AB (ref 65–99)
Potassium: 3.2 mmol/L — ABNORMAL LOW (ref 3.5–5.1)
SODIUM: 138 mmol/L (ref 135–145)
Total Bilirubin: 1.6 mg/dL — ABNORMAL HIGH (ref 0.3–1.2)
Total Protein: 7.4 g/dL (ref 6.5–8.1)

## 2017-06-13 LAB — CBC WITH DIFFERENTIAL/PLATELET
BASOS ABS: 0 10*3/uL (ref 0–0.1)
Basophils Relative: 0 %
EOS ABS: 0.2 10*3/uL (ref 0–0.7)
Eosinophils Relative: 1 %
HCT: 53 % — ABNORMAL HIGH (ref 40.0–52.0)
HEMOGLOBIN: 18.3 g/dL — AB (ref 13.0–18.0)
LYMPHS ABS: 3.9 10*3/uL — AB (ref 1.0–3.6)
Lymphocytes Relative: 27 %
MCH: 32.1 pg (ref 26.0–34.0)
MCHC: 34.6 g/dL (ref 32.0–36.0)
MCV: 92.9 fL (ref 80.0–100.0)
Monocytes Absolute: 0.7 10*3/uL (ref 0.2–1.0)
Monocytes Relative: 5 %
NEUTROS PCT: 67 %
Neutro Abs: 9.5 10*3/uL — ABNORMAL HIGH (ref 1.4–6.5)
PLATELETS: 228 10*3/uL (ref 150–440)
RBC: 5.7 MIL/uL (ref 4.40–5.90)
RDW: 14.2 % (ref 11.5–14.5)
WBC: 14.3 10*3/uL — AB (ref 3.8–10.6)

## 2017-06-13 LAB — LIPASE, BLOOD: Lipase: 27 U/L (ref 11–51)

## 2017-06-13 LAB — GLUCOSE, CAPILLARY: GLUCOSE-CAPILLARY: 320 mg/dL — AB (ref 65–99)

## 2017-06-13 LAB — LACTIC ACID, PLASMA
LACTIC ACID, VENOUS: 4.3 mmol/L — AB (ref 0.5–1.9)
Lactic Acid, Venous: 3.6 mmol/L (ref 0.5–1.9)
Lactic Acid, Venous: 2.6 mmol/L (ref 0.5–1.9)

## 2017-06-13 LAB — TROPONIN I: Troponin I: <0.03 ng/mL (ref <0.03)

## 2017-06-13 MED ORDER — METHYLPREDNISOLONE SODIUM SUCC 125 MG IJ SOLR
125.0000 mg | Freq: Once | INTRAMUSCULAR | Status: AC
  Administered 2017-06-13: 125 mg via INTRAVENOUS
  Filled 2017-06-13: qty 2

## 2017-06-13 MED ORDER — ONDANSETRON HCL 4 MG/2ML IJ SOLN
4.0000 mg | Freq: Once | INTRAMUSCULAR | Status: AC
  Administered 2017-06-13: 4 mg via INTRAVENOUS

## 2017-06-13 MED ORDER — SODIUM CHLORIDE 0.9 % IV BOLUS
1000.0000 mL | Freq: Once | INTRAVENOUS | Status: AC
  Administered 2017-06-13: 1000 mL via INTRAVENOUS

## 2017-06-13 MED ORDER — EPINEPHRINE 0.3 MG/0.3ML IJ SOAJ
0.3000 mg | Freq: Once | INTRAMUSCULAR | Status: AC
  Administered 2017-06-13: 0.3 mg via INTRAMUSCULAR

## 2017-06-13 MED ORDER — EPINEPHRINE 0.3 MG/0.3ML IJ SOAJ: 0.3000 mg | Freq: Once | INTRAMUSCULAR | 1 refills | Status: AC

## 2017-06-13 MED ORDER — PREDNISONE 20 MG PO TABS: 40.0000 mg | ORAL_TABLET | Freq: Every day | ORAL | 0 refills | Status: DC

## 2017-06-13 MED ORDER — SODIUM CHLORIDE 0.9 % IV SOLN
Freq: Once | INTRAVENOUS | Status: AC
  Administered 2017-06-13: 10:00:00 via INTRAVENOUS

## 2017-06-13 MED ORDER — ONDANSETRON HCL 4 MG/2ML IJ SOLN
INTRAMUSCULAR | Status: AC
  Filled 2017-06-13: qty 2

## 2017-06-13 MED ORDER — EPINEPHRINE 0.3 MG/0.3ML IJ SOAJ
INTRAMUSCULAR | Status: AC
  Filled 2017-06-13: qty 0.3

## 2017-06-13 MED ORDER — SODIUM CHLORIDE 0.9 % IV BOLUS
2000.0000 mL | Freq: Once | INTRAVENOUS | Status: AC
  Administered 2017-06-13: 2000 mL via INTRAVENOUS

## 2017-06-13 NOTE — ED Notes (Signed)
Family called RN to room. Pt vomited water. Pt told not to drink anymore.

## 2017-06-13 NOTE — ED Notes (Signed)
Returned from MRI 

## 2017-06-13 NOTE — ED Notes (Signed)
Dr Derrill Kaygoodman at bedside to update pt

## 2017-06-13 NOTE — ED Notes (Signed)
Pt to MRI. Ok to go without RN per dr Derrill Kaygoodman

## 2017-06-13 NOTE — Discharge Instructions (Addendum)
Please seek medical attention for any high fevers, chest pain, shortness of breath, change in behavior, persistent vomiting, bloody stool or any other new or concerning symptoms.  

## 2017-06-13 NOTE — ED Notes (Signed)
Pt given diet sprite. Ok per dr Derrill Kaygoodman.  Pt informed to let RN know if vomits

## 2017-06-13 NOTE — ED Notes (Signed)
Given ice water, ok per dr Derrill Kaygoodman

## 2017-06-13 NOTE — ED Notes (Signed)
Patient transported to CT with RN 

## 2017-06-13 NOTE — ED Notes (Signed)
Pt ambulated with nurse. Walked with steady gait. No complaints of N/V or dizziness.

## 2017-06-13 NOTE — ED Provider Notes (Addendum)
[facility] Emergency Department Provider Note  ____________________________________________   I have reviewed the triage vital signs and the nursing notes.   HISTORY  Chief Complaint Emesis and Fall   History limited by: Not Limited   HPI Alejandro Marquez is a 62 y.o. male who presents to the emergency department today via EMS. Patient states that he started feeling bad this morning. Took a shower and then felt itchy and that his lips were swelling. Felt nauseous. Tried driving to work when he had to pull off the side of the road. Apparently after he got out of the car he fell and hit his head. He denies remembering that incident. He denies any chest pain. Denies any allergies. Has history of diabetes. States he was in his normal state of health yesterday. Denies any chest pain. Denies any fevers.   Per medical record review patient has a history of DM  Past Medical History:  Diagnosis Date  . Diabetes mellitus without complication (HCC)     There are no active problems to display for this patient.   Past Surgical History:  Procedure Laterality Date  . HERNIA REPAIR      Prior to Admission medications   Not on File    Allergies Patient has no known allergies.  History reviewed. No pertinent family history.  Social History Social History   Tobacco Use  . Smoking status: Never Smoker  . Smokeless tobacco: Never Used  Substance Use Topics  . Alcohol use: Never    Frequency: Never  . Drug use: Never    Review of Systems Constitutional: No fever/chills Eyes: No visual changes. ENT: Positive for lip swelling. Cardiovascular: Denies chest pain. Respiratory: Denies shortness of breath. Gastrointestinal: No abdominal pain. Positive for nausea.  Genitourinary: Negative for dysuria. Musculoskeletal: Negative for back pain. Skin: Positive for itchiness. Neurological: Negative for headaches, focal weakness or  numbness.  ____________________________________________   PHYSICAL EXAM:  VITAL SIGNS: ED Triage Vitals  Enc Vitals Group     BP -- 60/45     Pulse -- 113     Resp -- 18     Temp --      Temp src --      SpO2 -- 89     Weight 06/13/17 0719 197 lb (89.4 kg)     Height 06/13/17 0719 5\' 11"  (1.803 m)     Head Circumference --      Peak Flow --      Pain Score 06/13/17 0718 0   Constitutional: Alert and oriented.  Eyes: Conjunctivae are normal.  ENT   Head: Normocephalic and atraumatic.   Nose: No congestion/rhinnorhea.   Mouth/Throat: Mucous membranes are moist. Slight swelling to the lower lip.   Neck: No stridor. Hematological/Lymphatic/Immunilogical: No cervical lymphadenopathy. Cardiovascular: Tachycardic, regular rhythm.  No murmurs, rubs, or gallops.  Respiratory: Normal respiratory effort without tachypnea nor retractions. Breath sounds are clear and equal bilaterally. No wheezes/rales/rhonchi. Gastrointestinal: Soft and non tender. No rebound. No guarding.  Genitourinary: Deferred Musculoskeletal: Normal range of motion in all extremities. No lower extremity edema. Neurologic:  Normal speech and language. No gross focal neurologic deficits are appreciated.  Skin:  Skin is warm, dry and intact. No rash noted. Psychiatric: Mood and affect are normal. Speech and behavior are normal. Patient exhibits appropriate insight and judgment.  ____________________________________________    LABS (pertinent positives/negatives)  CMP na 138, k 3.2, glu 322 WBC 14.3, hgb 18.3, plt 228 Lipase 27 Trop <0.03 Lactic 4.3-2.6  ____________________________________________   EKG  Lurline IdolI, Ceyda Peterka, attending physician, personally viewed and interpreted this EKG  EKG Time: 0715 Rate: 113 Rhythm: sinus tachycardia Axis: left axis deviation Intervals: qtc 519 QRS: narrow ST changes: no st elevation Impression: abnormal  ekg  ____________________________________________    RADIOLOGY  CXR No acute abnormality  CT head Question cerebellum infarct  MRI brain No abnormality   ____________________________________________   PROCEDURES  Procedures  CRITICAL CARE Performed by: Phineas SemenGOODMAN, Berenise Hunton   Total critical care time: 40 minutes  Critical care time was exclusive of separately billable procedures and treating other patients.  Critical care was necessary to treat or prevent imminent or life-threatening deterioration.  Critical care was time spent personally by me on the following activities: development of treatment plan with patient and/or surrogate as well as nursing, discussions with consultants, evaluation of patient's response to treatment, examination of patient, obtaining history from patient or surrogate, ordering and performing treatments and interventions, ordering and review of laboratory studies, ordering and review of radiographic studies, pulse oximetry and re-evaluation of patient's condition.  ____________________________________________   INITIAL IMPRESSION / ASSESSMENT AND PLAN / ED COURSE  Pertinent labs & imaging results that were available during my care of the patient were reviewed by me and considered in my medical decision making (see chart for details).  Patient presented to the emergency department today after an episode of syncope.  Patient's initial blood pressure was very low.  Unclear etiology initially.  However given that the patient did complain of some lip swelling and nausea and itchiness I did have concern for possible anaphylactic reaction to unknown allergen.  Patient was given IV fluids and epinephrine.  He did seem to respond well to this.  Blood pressure did improve and stayed improved throughout his emergency department stay.  Additionally had some concerns for infection however the patient had no other symptoms to point to specific infectious source.   That being said chest x-ray was checked she did not show anything concerning.  Initial lactate was elevated which I think is likely secondary to the hypoperfusion.  This did improve after some IV hydration.  Patient was watched in the emergency department for a number of hours without any worsening or return to the initial presentation.  Prior to discharge she did have a discussion with patient and family about further infectious work-up however at this point they felt comfortable going home with presumed anaphylactic diagnosis.  We did discuss strict return precautions for infectious signs and symptoms. ____________________________________   FINAL CLINICAL IMPRESSION(S) / ED DIAGNOSES  Final diagnoses:  Anaphylaxis, initial encounter     Note: This dictation was prepared with Dragon dictation. Any transcriptional errors that result from this process are unintentional     Phineas SemenGoodman, Reilynn Lauro, MD 06/13/17 1605    Phineas SemenGoodman, Hernan Turnage, MD 06/20/17 [phone]

## 2017-06-13 NOTE — ED Triage Notes (Signed)
Was driving to work, had vomiting. Got out of car and fell and hit head.  Grey/pale on EMS arrival with diaphoresis.

## 2017-06-13 NOTE — ED Notes (Signed)
Date and time results received: 06/13/17 0812 (use smartphrase ".now" to insert current time)  Test: lactic acid Critical Value: 4.3  Name of Provider Notified: goodman

## 2017-06-13 NOTE — ED Notes (Signed)
Waiting to go for MRI.  Pt remains alert and oriented. VSS at this time.

## 2017-06-13 NOTE — ED Notes (Signed)
Pt vomited again, dr Derrill Kaygoodman aware

## 2017-06-14 ENCOUNTER — Encounter: Payer: Self-pay | Admitting: Family Medicine

## 2017-06-18 ENCOUNTER — Encounter: Payer: Self-pay | Admitting: Family Medicine

## 2017-06-18 ENCOUNTER — Ambulatory Visit: Payer: BLUE CROSS/BLUE SHIELD | Admitting: Family Medicine

## 2017-06-18 VITALS — BP 124/72 | HR 87 | Temp 97.9°F | Resp 14 | Ht 71.0 in | Wt 188.2 lb

## 2017-06-18 DIAGNOSIS — E119 Type 2 diabetes mellitus without complications: Secondary | ICD-10-CM | POA: Diagnosis not present

## 2017-06-18 LAB — POCT GLYCOSYLATED HEMOGLOBIN (HGB A1C): HEMOGLOBIN A1C: 6.5

## 2017-06-18 NOTE — Patient Instructions (Signed)
Try Mucinex D for congestion and Delsym for cough. 

## 2017-06-18 NOTE — Progress Notes (Signed)
  Subjective:     Patient ID: Alejandro Marquez, male   DOB: 1955-03-30, 62 y.o.   MRN: [id number] Chief Complaint  Patient presents with  . Follow-up    3 months  . Diabetes   HPI Also was in the ER 06/13/17 for an anaphylactic episode. Reports he had taken some old Alka Seltzer that day for cold sx. Also has started a multi-vitamin. States he remains hoarse but his cold sx are improving. Has had no further allergic sx.  Review of Systems     Objective:   Physical Exam  Constitutional: He appears well-developed and well-nourished. No distress.  Lungs: clear Heart: RRR without murmur Lower extremities: no edema; pedal pulses intact, sensation to monofilament intact, no wounds noted.     Assessment:    1. Type 2 diabetes mellitus without complication, without long-term current use of insulin (HCC): controlled - POCT HgB A1C    Plan:    Discussed use of Mucinex D and Delsym for his residual cold sx.

## 2017-09-17 ENCOUNTER — Encounter: Payer: Self-pay | Admitting: Family Medicine

## 2017-09-17 ENCOUNTER — Ambulatory Visit: Payer: BLUE CROSS/BLUE SHIELD | Admitting: Family Medicine

## 2017-09-17 VITALS — BP 130/86 | HR 87 | Temp 98.4°F | Resp 16 | Wt 187.2 lb

## 2017-09-17 DIAGNOSIS — E119 Type 2 diabetes mellitus without complications: Secondary | ICD-10-CM

## 2017-09-17 DIAGNOSIS — R7401 Elevation of levels of liver transaminase levels: Secondary | ICD-10-CM

## 2017-09-17 DIAGNOSIS — Z125 Encounter for screening for malignant neoplasm of prostate: Secondary | ICD-10-CM | POA: Diagnosis not present

## 2017-09-17 DIAGNOSIS — R740 Nonspecific elevation of levels of transaminase and lactic acid dehydrogenase [LDH]: Secondary | ICD-10-CM

## 2017-09-17 LAB — POCT GLYCOSYLATED HEMOGLOBIN (HGB A1C): Hemoglobin A1C: 6.8 % — AB (ref 4.0–5.6)

## 2017-09-17 NOTE — Patient Instructions (Addendum)
We will call you with the lab results when completed before next office visit.

## 2017-09-17 NOTE — Progress Notes (Signed)
  Subjective:     Patient ID: Alejandro Marquez, male   DOB: 02/01/1956, 62 y.o.   MRN: [id number] Chief Complaint  Patient presents with  . Diabetes    Patient returns to office today for follow up from 06/18/17, at last office visit HgbA1C was 6.5%. Patient reports good compliance and symptom control on medication, he denies polydipsia or polyuria.    HPI   Review of Systems  Respiratory: Negative for shortness of breath.   Cardiovascular: Negative for chest pain and palpitations.       Objective:   Physical Exam  Constitutional: He appears well-developed and well-nourished. No distress.  Cardiovascular: Normal rate and regular rhythm.  Pulmonary/Chest: Breath sounds normal.  Musculoskeletal: He exhibits no edema (of lower extremities).       Assessment:    1. Type 2 diabetes mellitus without complication, without long-term current use of insulin (HCC) - POCT glycosylated hemoglobin (Hb A1C) - Lipid panel - Comprehensive metabolic panel - Microalbumin / creatinine urine ratio  2. Screening for prostate cancer - PSA  3. Elevated transaminase level - Comprehensive metabolic panel    Plan:    He will get labs prior to next office visit.

## 2017-10-03 DIAGNOSIS — Z125 Encounter for screening for malignant neoplasm of prostate: Secondary | ICD-10-CM | POA: Diagnosis not present

## 2017-10-03 DIAGNOSIS — E119 Type 2 diabetes mellitus without complications: Secondary | ICD-10-CM | POA: Diagnosis not present

## 2017-10-03 DIAGNOSIS — R740 Nonspecific elevation of levels of transaminase and lactic acid dehydrogenase [LDH]: Secondary | ICD-10-CM | POA: Diagnosis not present

## 2017-10-04 ENCOUNTER — Telehealth: Payer: Self-pay

## 2017-10-04 LAB — COMPREHENSIVE METABOLIC PANEL
ALBUMIN: 4.4 g/dL (ref 3.6–4.8)
ALT: 61 IU/L — ABNORMAL HIGH (ref 0–44)
AST: 49 IU/L — ABNORMAL HIGH (ref 0–40)
Albumin/Globulin Ratio: 1.5 (ref 1.2–2.2)
Alkaline Phosphatase: 60 IU/L (ref 39–117)
BUN / CREAT RATIO: 15 (ref 10–24)
BUN: 14 mg/dL (ref 8–27)
Bilirubin Total: 0.9 mg/dL (ref 0.0–1.2)
CALCIUM: 9.6 mg/dL (ref 8.6–10.2)
CO2: 25 mmol/L (ref 20–29)
Chloride: 103 mmol/L (ref 96–106)
Creatinine, Ser: 0.92 mg/dL (ref 0.76–1.27)
GFR calc Af Amer: 104 mL/min/{1.73_m2} (ref >59)
GFR, EST NON AFRICAN AMERICAN: 90 mL/min/{1.73_m2} (ref >59)
GLOBULIN, TOTAL: 2.9 g/dL (ref 1.5–4.5)
Glucose: 140 mg/dL — ABNORMAL HIGH (ref 65–99)
Potassium: 4.2 mmol/L (ref 3.5–5.2)
SODIUM: 142 mmol/L (ref 134–144)
Total Protein: 7.3 g/dL (ref 6.0–8.5)

## 2017-10-04 LAB — LIPID PANEL
Chol/HDL Ratio: 1.9 ratio (ref 0.0–5.0)
Cholesterol, Total: 60 mg/dL — ABNORMAL LOW (ref 100–199)
HDL: 31 mg/dL — ABNORMAL LOW (ref >39)
LDL Calculated: 19 mg/dL (ref 0–99)
Triglycerides: 52 mg/dL (ref 0–149)
VLDL Cholesterol Cal: 10 mg/dL (ref 5–40)

## 2017-10-04 LAB — PSA: Prostate Specific Ag, Serum: 1.6 ng/mL (ref 0.0–4.0)

## 2017-10-04 LAB — MICROALBUMIN / CREATININE URINE RATIO
Creatinine, Urine: 235.7 mg/dL
Microalb/Creat Ratio: 4.5 mg/g creat (ref 0.0–30.0)
Microalbumin, Urine: 10.7 ug/mL

## 2017-10-04 NOTE — Telephone Encounter (Signed)
Patient advised.KW 

## 2017-10-04 NOTE — Telephone Encounter (Signed)
-----   Message from Anola Gurneyobert Chauvin, [place] sent at 10/04/2017  1:40 PM EDT ----- Urine test ok for protein

## 2017-10-04 NOTE — Telephone Encounter (Signed)
[id number] 

## 2017-10-04 NOTE — Telephone Encounter (Signed)
-----   Message from Anola Gurneyobert Chauvin, [place] sent at 10/04/2017  7:28 AM EDT ----- Labs are good, mild elevation of liver tests improving.

## 2017-10-08 LAB — HM DIABETES EYE EXAM

## 2017-10-09 DIAGNOSIS — H524 Presbyopia: Secondary | ICD-10-CM | POA: Diagnosis not present

## 2017-10-09 DIAGNOSIS — E119 Type 2 diabetes mellitus without complications: Secondary | ICD-10-CM | POA: Diagnosis not present

## 2017-10-12 ENCOUNTER — Encounter: Payer: Self-pay | Admitting: Family Medicine

## 2017-12-06 ENCOUNTER — Other Ambulatory Visit: Payer: Self-pay | Admitting: Family Medicine

## 2017-12-06 DIAGNOSIS — E119 Type 2 diabetes mellitus without complications: Secondary | ICD-10-CM

## 2018-02-27 DIAGNOSIS — D485 Neoplasm of uncertain behavior of skin: Secondary | ICD-10-CM | POA: Diagnosis not present

## 2018-02-27 DIAGNOSIS — L986 Other infiltrative disorders of the skin and subcutaneous tissue: Secondary | ICD-10-CM | POA: Diagnosis not present

## 2018-03-21 ENCOUNTER — Encounter: Payer: Self-pay | Admitting: Family Medicine

## 2018-03-21 ENCOUNTER — Ambulatory Visit: Payer: BLUE CROSS/BLUE SHIELD | Admitting: Family Medicine

## 2018-03-21 VITALS — BP 144/76 | HR 73 | Temp 97.6°F | Resp 15 | Wt 190.2 lb

## 2018-03-21 DIAGNOSIS — L719 Rosacea, unspecified: Secondary | ICD-10-CM | POA: Insufficient documentation

## 2018-03-21 DIAGNOSIS — E119 Type 2 diabetes mellitus without complications: Secondary | ICD-10-CM

## 2018-03-21 LAB — POCT GLYCOSYLATED HEMOGLOBIN (HGB A1C): Hemoglobin A1C: 6.8 % — AB (ref 4.0–5.6)

## 2018-03-21 NOTE — Patient Instructions (Signed)
Make a follow up visit in 6 months. You will be due labs at that time.

## 2018-03-21 NOTE — Progress Notes (Signed)
  Subjective:     Patient ID: Alejandro Marquez, male   DOB: Feb 22, 1955, 63 y.o.   MRN: [id number] Chief Complaint  Patient presents with  . Diabetes    Patient returns to office for 6 month follow up, patients last HgbA1C was 6.8%. Patient denies episodes of hyper/hypoglycemia incidents since last visit. Patient denies symptoms of polydipsia, polyuria or changes to feet. Patient reports good compluance and tolerance on medication.    HPI Defers flu vaccine.  Review of Systems  Respiratory: Negative for shortness of breath.   Cardiovascular: Negative for chest pain and palpitations.       Objective:   Physical Exam Constitutional:      General: He is not in acute distress. Neurological:     Mental Status: He is alert.   Lungs: clear Heart: RRR without murmur Lower extremities: no edema; pedal pulses intact, sensation to monofilament intact, no wounds noted.     Assessment:    1. Type 2 diabetes mellitus without complication, without long-term current use of insulin (HCC) - POCT glycosylated hemoglobin (Hb A1C)    Plan:    Will establish with new PCP in 6 months. Labs will be due at that time.

## 2018-03-30 ENCOUNTER — Other Ambulatory Visit: Payer: Self-pay | Admitting: Family Medicine

## 2018-03-30 DIAGNOSIS — E119 Type 2 diabetes mellitus without complications: Secondary | ICD-10-CM

## 2018-07-25 ENCOUNTER — Other Ambulatory Visit: Payer: Self-pay | Admitting: Family Medicine

## 2018-07-25 DIAGNOSIS — E119 Type 2 diabetes mellitus without complications: Secondary | ICD-10-CM

## 2018-07-25 NOTE — Telephone Encounter (Signed)
[facility] faxed refill request for the following medications:  glipiZIDE (GLUCOTROL) 10 MG tablet   Please advise.  

## 2018-07-26 MED ORDER — METFORMIN HCL 1000 MG PO TABS: 1000.0000 mg | ORAL_TABLET | Freq: Two times a day (BID) | ORAL | 0 refills | Status: DC

## 2018-07-29 ENCOUNTER — Other Ambulatory Visit: Payer: Self-pay | Admitting: Family Medicine

## 2018-07-29 DIAGNOSIS — E119 Type 2 diabetes mellitus without complications: Secondary | ICD-10-CM

## 2018-07-29 NOTE — Telephone Encounter (Signed)
Pt needs a refill on his Glipizide 10 mg  [facility]  [employer]

## 2018-07-30 MED ORDER — GLIPIZIDE 10 MG PO TABS: 10.0000 mg | ORAL_TABLET | Freq: Two times a day (BID) | ORAL | 0 refills | Status: DC

## 2018-08-21 IMAGING — CT CT HEAD W/O CM
3 of 6 series · 15 of 47 positions shown, 18 images · non-contrast
Comparison: None.

CLINICAL DATA: Pain following fall 6

EXAM:
CT HEAD WITHOUT CONTRAST
TECHNIQUE: Contiguous axial images were obtained from the base of the skull
through the vertex without intravenous contrast.

[Series 2: head wo · axial · 0.41mm/px · z∈[-97,+38]mm · 10 of 33 slices shown, 13 images]
[im 3/33  brain]
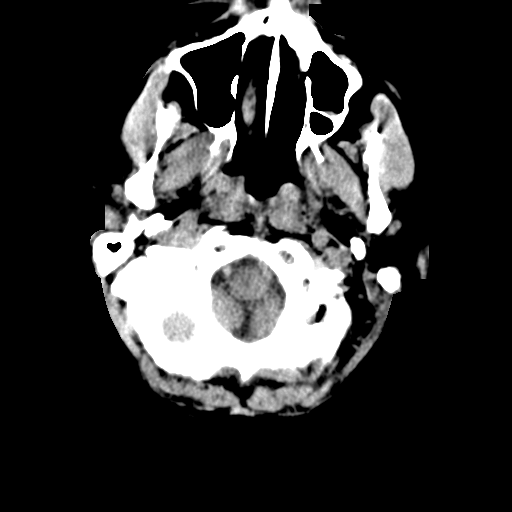
[im 3/33  bone]
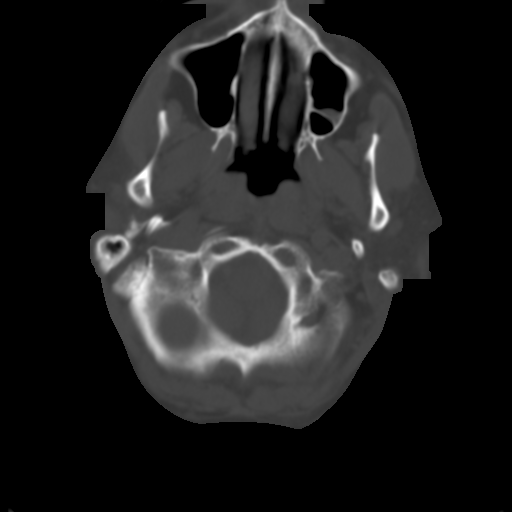
[im 5/33  brain]
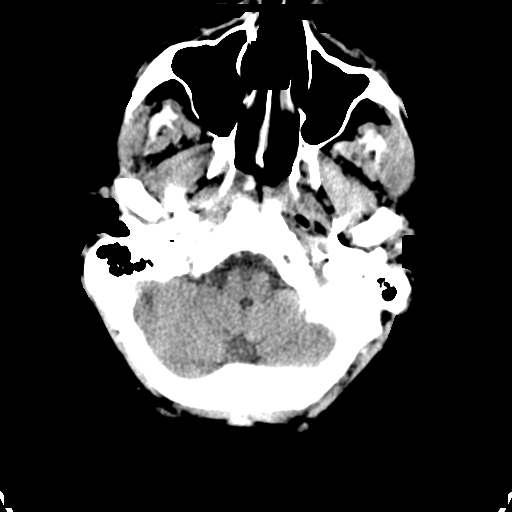
[im 10/33  brain]
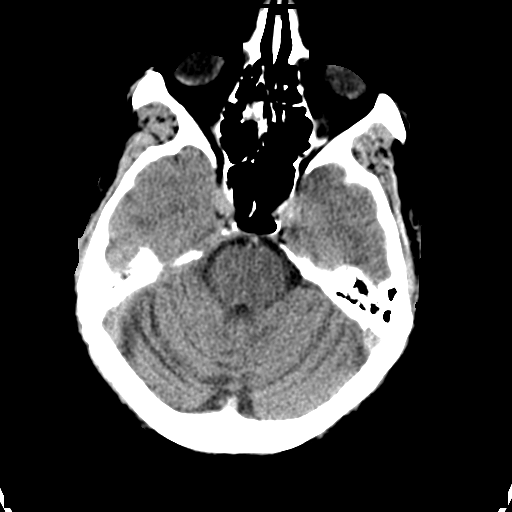
[im 12/33  brain]
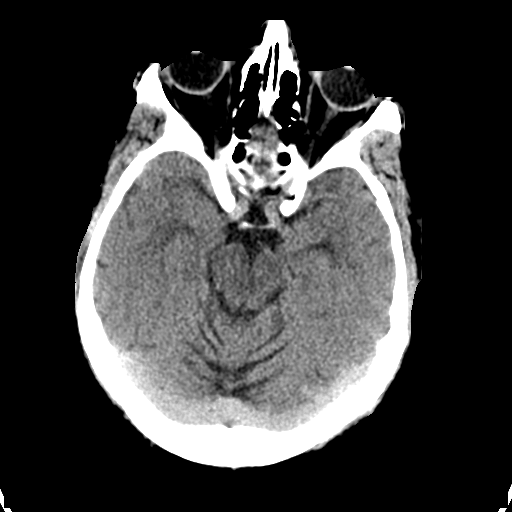
[im 14/33  brain]
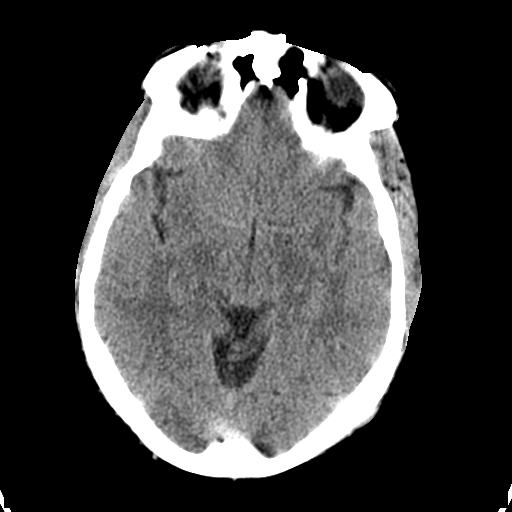
[im 14/33  bone]
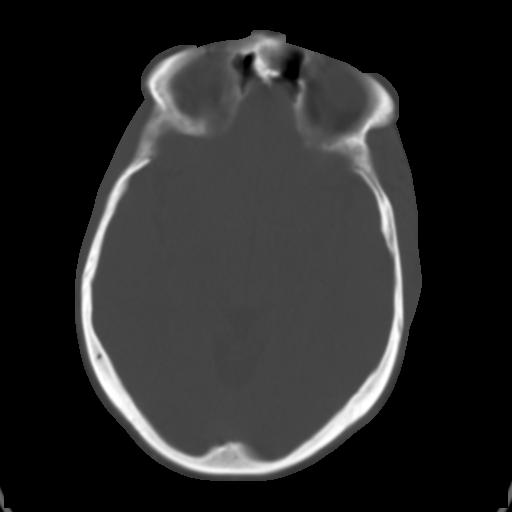
[im 19/33  brain]
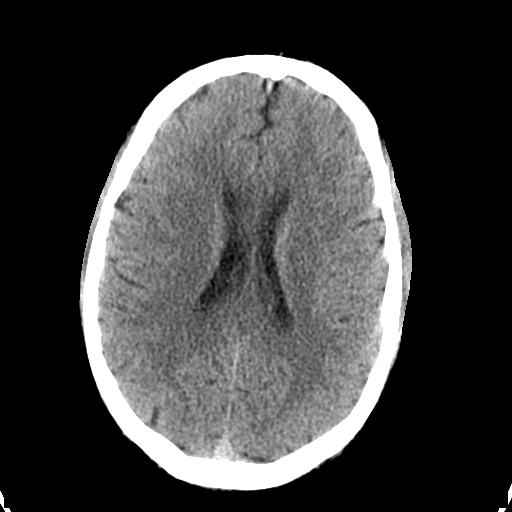
[im 21/33  brain]
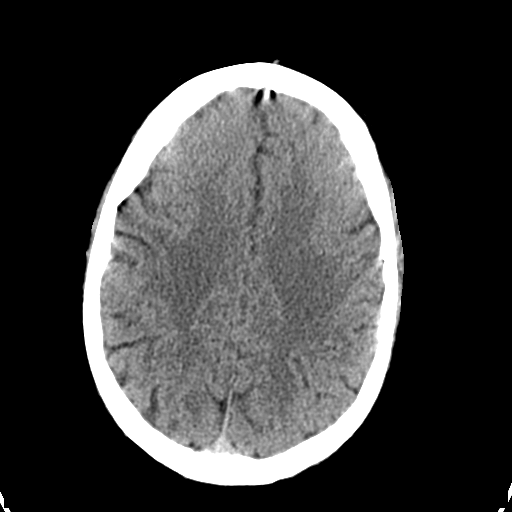
[im 23/33  brain]
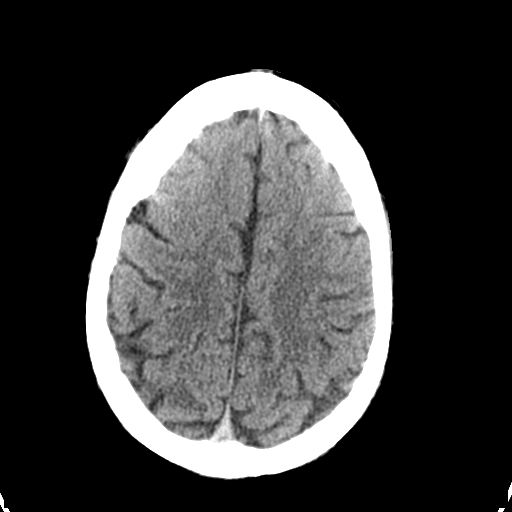
[im 28/33  brain]
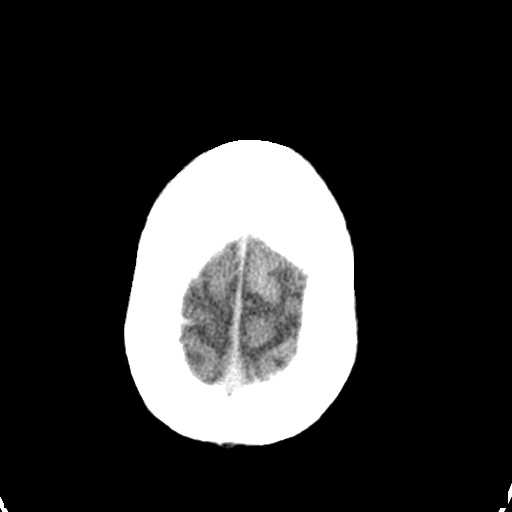
[im 28/33  bone]
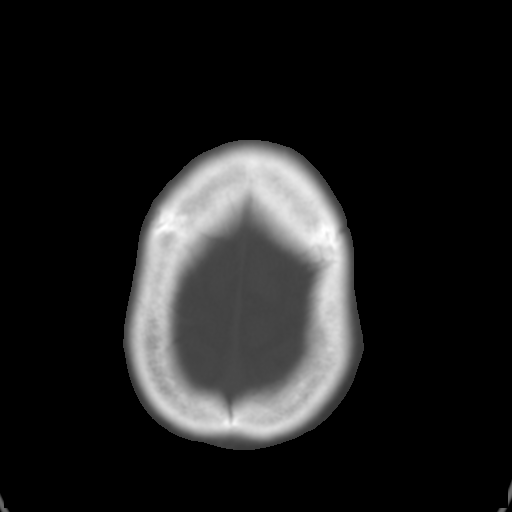
[im 30/33  brain]
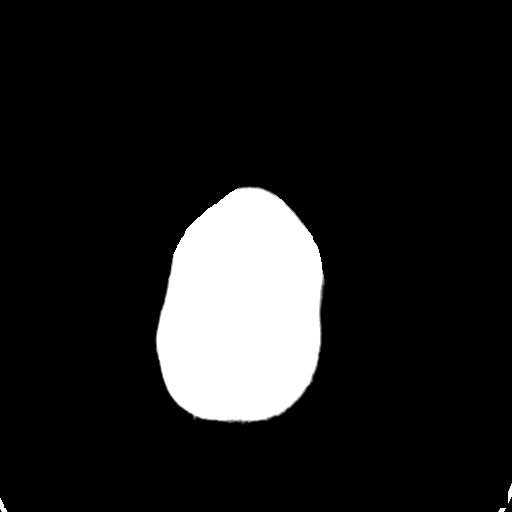

[Series 6: coronal soft tissue · coronal · 0.33mm/px · 3 of 65 slices shown]
[im 18/65  brain]
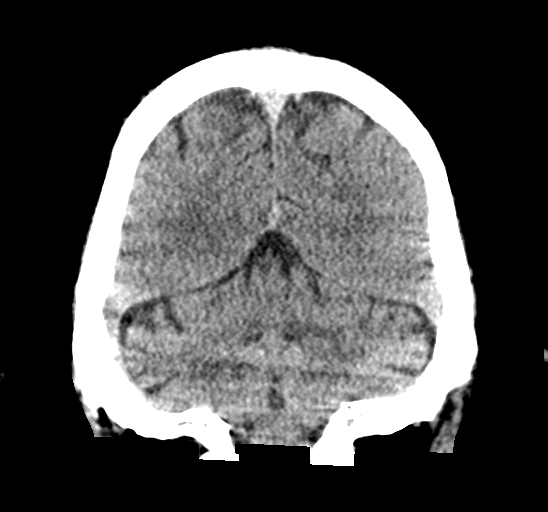
[im 31/65  brain]
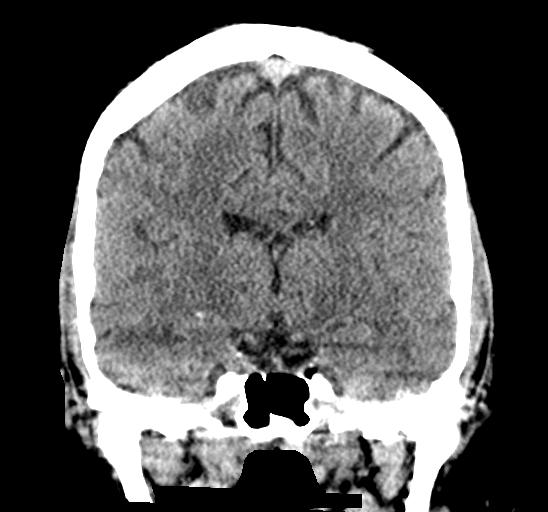
[im 43/65  brain]
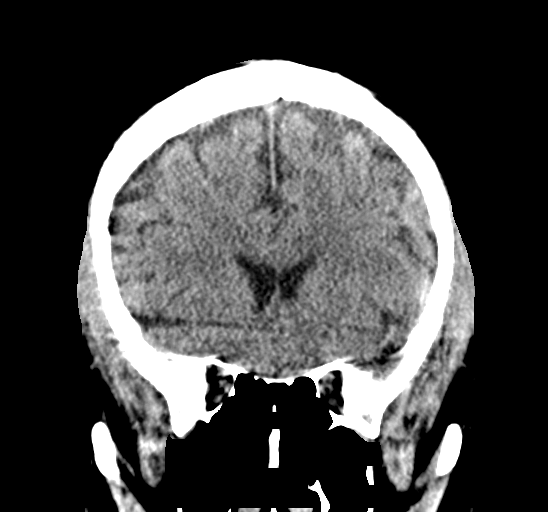

[Series 9: sagittal soft tissue · sagittal · 0.33mm/px · 2 of 52 slices shown]
[im 18/52  brain]
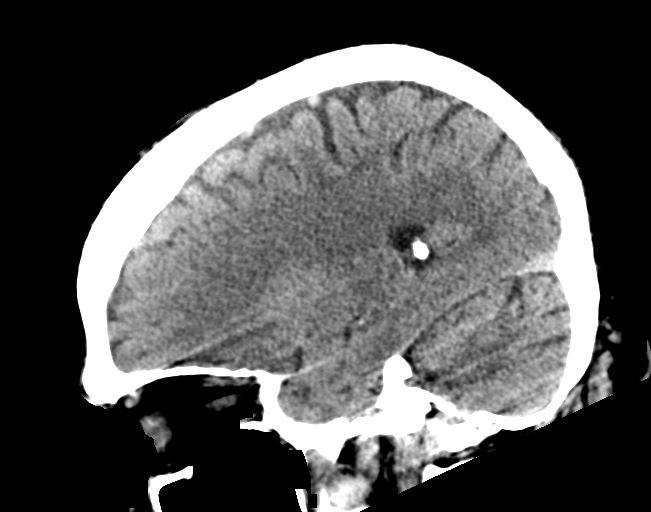
[im 35/52  brain]
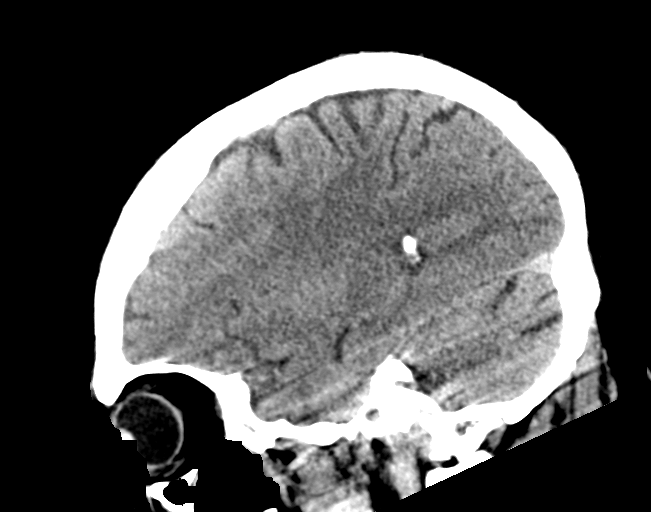

[15 of 47 positions shown; findings below may reference images not displayed]

FINDINGS: Brain: The ventricles are normal in size and configuration. There is
no demonstrable mass, hemorrhage, extra-axial fluid collection, or
midline shift. There are areas of decreased attenuation in the
inferior cerebellum on the right somewhat laterally. This appearance
is concerning for age uncertain and potentially recent infarct in
this area. Elsewhere gray-white compartments appear normal. No acute
infarct is demonstrable.

Vascular: No hyperdense vessels are evident. No appreciable vascular
calcification.

Skull: Bony calvarium appears intact.

Sinuses/Orbits: There is a small air-fluid level in the posterior
right sphenoid sinus. There is mucosal thickening in multiple
ethmoid air cells. There are retention cysts in each inferior
maxillary antrum. Other paranasal sinuses are clear. Orbits appear
symmetric bilaterally.

Other: Mastoid air cells are clear.
IMPRESSION: Decreased attenuation in the right inferior cerebellum somewhat
laterally. Appearance is concerning for age uncertain and
potentially recent infarct in this area. Gray-white compartments
elsewhere appear normal. No mass or hemorrhage.

Foci of paranasal sinus disease evident.

## 2018-09-11 ENCOUNTER — Ambulatory Visit: Payer: BLUE CROSS/BLUE SHIELD | Admitting: Physician Assistant

## 2018-09-25 ENCOUNTER — Ambulatory Visit (INDEPENDENT_AMBULATORY_CARE_PROVIDER_SITE_OTHER): Payer: BC Managed Care – PPO | Admitting: Physician Assistant

## 2018-09-25 ENCOUNTER — Encounter: Payer: Self-pay | Admitting: Physician Assistant

## 2018-09-25 ENCOUNTER — Other Ambulatory Visit: Payer: Self-pay

## 2018-09-25 VITALS — BP 130/82 | HR 72 | Temp 98.2°F | Resp 16 | Ht 71.0 in | Wt 181.0 lb

## 2018-09-25 DIAGNOSIS — Z23 Encounter for immunization: Secondary | ICD-10-CM | POA: Diagnosis not present

## 2018-09-25 DIAGNOSIS — E119 Type 2 diabetes mellitus without complications: Secondary | ICD-10-CM

## 2018-09-25 DIAGNOSIS — Z1211 Encounter for screening for malignant neoplasm of colon: Secondary | ICD-10-CM

## 2018-09-25 LAB — POCT GLYCOSYLATED HEMOGLOBIN (HGB A1C)
Est. average glucose Bld gHb Est-mCnc: 123
Hemoglobin A1C: 5.9 % — AB (ref 4.0–5.6)

## 2018-09-25 LAB — POCT UA - MICROALBUMIN: Microalbumin Ur, POC: 50 mg/L

## 2018-09-25 NOTE — Progress Notes (Signed)
Patient: Alejandro Marquez Male    DOB: 01-12-1956   63 y.o.   MRN: [id number] Visit Date: 09/25/2018  Today's Provider: [name], PA-C   Chief Complaint  Patient presents with  . Diabetes   Subjective:    Patient was seeing Mariel Sleet, PA-C who has since retired.   HPI   Lives in [place], [place]. Two children aged 73. 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    Diabetes Mellitus Type II, Follow-up:   Lab Results  Component Value Date   HGBA1C 5.9 (A) 09/25/2018   HGBA1C 6.8 (A) 03/21/2018   HGBA1C 6.8 (A) 09/17/2017    Last seen for diabetes 6 months ago.  Management since then includes checking A1c. He reports excellent compliance with treatment. He is not having side effects.  Current symptoms include none and have been improving. Home blood sugar records: fasting range: not being checked  Episodes of hypoglycemia? no   Current insulin regiment: Is not on insulin Most Recent Eye Exam: 10/09/2018 [facility], normal - due this year Weight trend: stable Prior visit with dietician: No Current exercise: walking Current diet habits: not asked  Pertinent Labs:    Component Value Date/Time   CHOL 60 (L) 10/03/2017 0811   TRIG 52 10/03/2017 0811   HDL 31 (L) 10/03/2017 0811   LDLCALC 19 10/03/2017 0811   LDLCALC 16 01/12/2017 0908   CREATININE 0.92 10/03/2017 0811   CREATININE 0.83 01/12/2017 0908    Wt Readings from Last 3 Encounters:  09/25/18 181 lb (82.1 kg)  03/21/18 190 lb 3.2 oz (86.3 kg)  09/17/17 187 lb 3.2 oz (84.9 kg)   Lipid Panel: Not on statin, cholesterol has historically been very low.     Component Value Date/Time   CHOL 60 (L) 10/03/2017 0811   TRIG 52 10/03/2017 0811   HDL 31 (L) 10/03/2017 0811   CHOLHDL 1.9 10/03/2017 0811   CHOLHDL 1.9 01/12/2017 0908   LDLCALC 19 10/03/2017 0811   LDLCALC 16 01/12/2017 0908   Colon Cancer Screening: Not up to date. Agreeable to cologuard.    Refuses pneumonia vaccine. Says he never gets any vaccines and he doesn't need any.  ------------------------------------------------------------------------  No Known Allergies   Current Outpatient Medications:  .  glipiZIDE (GLUCOTROL) 10 MG tablet, Take 1 tablet (10 mg total) by mouth 2 (two) times daily before a meal., Disp: 180 tablet, Rfl: 0 .  metFORMIN (GLUCOPHAGE) 1000 MG tablet, Take 1 tablet (1,000 mg total) by mouth 2 (two) times daily with a meal., Disp: 60 tablet, Rfl: 0  Review of Systems  Constitutional: Negative.   Eyes: Negative.   Cardiovascular: Negative.   Endocrine: Negative.     Social History   Tobacco Use  . Smoking status: Never Smoker  . Smokeless tobacco: Never Used  Substance Use Topics  . Alcohol use: Never    Frequency: Never      Objective:   BP 130/82 (BP Location: Right Arm, Patient Position: Sitting, Cuff Size: Normal)   Pulse 72   Temp 98.2 F (36.8 C) (Oral)   Resp 16   Ht 5\' 11"  (1.803 m)   Wt 181 lb (82.1 kg)   BMI 25.24 kg/m  Vitals:   09/25/18 1557  BP: 130/82  Pulse: 72  Resp: 16  Temp: 98.2 F (36.8 C)  TempSrc: Oral  Weight: 181 lb (82.1 kg)  Height: 5\' 11"  (1.803 m)  Physical Exam Constitutional:      Appearance: Normal appearance.  Cardiovascular:     Rate and Rhythm: Normal rate and regular rhythm.  Pulmonary:     Effort: Pulmonary effort is normal.     Breath sounds: Normal breath sounds.  Skin:    General: Skin is warm and dry.  Neurological:     Mental Status: He is alert and oriented to person, place, and time. Mental status is at baseline.  Psychiatric:        Mood and Affect: Mood normal.        Behavior: Behavior normal.      Results for orders placed or performed in visit on 09/25/18  POCT glycosylated hemoglobin (Hb A1C)  Result Value Ref Range   Hemoglobin A1C 5.9 (A) 4.0 - 5.6 %   Est. average glucose Bld gHb Est-mCnc 123    Diabetic Foot Exam - Simple   Simple Foot Form  Diabetic Foot exam was performed with the following findings: Yes 09/25/2018  4:13 PM  Visual Inspection No deformities, no ulcerations, no other skin breakdown bilaterally: Yes Sensation Testing Intact to touch and monofilament testing bilaterally: Yes Pulse Check Posterior Tibialis and Dorsalis pulse intact bilaterally: Yes Comments Onychomycosis on all toenails         Assessment & Plan    1. Type 2 diabetes mellitus without complication, without long-term current use of insulin (HCC)  Well controlled today. Follow up in 6 months. Cholesterol is very low, will defer statin.   - POCT glycosylated hemoglobin (Hb A1C) - POCT UA - Microalbumin - Lipid Profile - Comprehensive Metabolic Panel (CMET)  2. Need for vaccination against Streptococcus pneumoniae  Refused. Counseled on importance of this vaccination in diabetes. Continues to refuse.   3. Colon cancer screening  Explained risks and benefits of cologuard.   - Cologuard  The entirety of the information documented in the History of Present Illness, Review of Systems and Physical Exam were personally obtained by me. Portions of this information were initially documented by Rondel BatonSulibeya Dimas, CMA and reviewed by me for thoroughness and accuracy.      Trey SailorsAdriana M Pollak, PA-C  [facility]

## 2018-10-03 DIAGNOSIS — E119 Type 2 diabetes mellitus without complications: Secondary | ICD-10-CM | POA: Diagnosis not present

## 2018-10-04 ENCOUNTER — Telehealth: Payer: Self-pay

## 2018-10-04 LAB — LIPID PANEL
Chol/HDL Ratio: 1.8 ratio (ref 0.0–5.0)
Cholesterol, Total: 64 mg/dL — ABNORMAL LOW (ref 100–199)
HDL: 35 mg/dL — ABNORMAL LOW (ref >39)
LDL Calculated: 22 mg/dL (ref 0–99)
Triglycerides: 35 mg/dL (ref 0–149)
VLDL Cholesterol Cal: 7 mg/dL (ref 5–40)

## 2018-10-04 LAB — COMPREHENSIVE METABOLIC PANEL
ALT: 57 IU/L — ABNORMAL HIGH (ref 0–44)
AST: 47 IU/L — ABNORMAL HIGH (ref 0–40)
Albumin/Globulin Ratio: 1.6 (ref 1.2–2.2)
Albumin: 4.5 g/dL (ref 3.8–4.8)
Alkaline Phosphatase: 56 IU/L (ref 39–117)
BUN/Creatinine Ratio: 12 (ref 10–24)
BUN: 13 mg/dL (ref 8–27)
Bilirubin Total: 1 mg/dL (ref 0.0–1.2)
CO2: 24 mmol/L (ref 20–29)
Calcium: 9.3 mg/dL (ref 8.6–10.2)
Chloride: 103 mmol/L (ref 96–106)
Creatinine, Ser: 1.09 mg/dL (ref 0.76–1.27)
GFR calc Af Amer: 84 mL/min/{1.73_m2} (ref >59)
GFR calc non Af Amer: 73 mL/min/{1.73_m2} (ref >59)
Globulin, Total: 2.8 g/dL (ref 1.5–4.5)
Glucose: 126 mg/dL — ABNORMAL HIGH (ref 65–99)
Potassium: 4.2 mmol/L (ref 3.5–5.2)
Sodium: 142 mmol/L (ref 134–144)
Total Protein: 7.3 g/dL (ref 6.0–8.5)

## 2018-10-04 NOTE — Telephone Encounter (Signed)
Left message for patient to call back for lab results

## 2018-10-04 NOTE — Telephone Encounter (Signed)
Pt returned missed call.  Please call pt back. ° °Thanks, °TGH °

## 2018-10-04 NOTE — Telephone Encounter (Signed)
-----   Message from [name], [place] sent at 10/04/2018  1:13 PM EDT ----- His labs are stable however his liver enzymes are slightly high and have been for at least three years. Does he use alcohol? If so, he should decrease. Has he ever had these worked up before? If not, will take a second look at follow up.

## 2018-10-07 NOTE — Telephone Encounter (Signed)
OK thanks, will continue to monitor.

## 2018-10-07 NOTE — Telephone Encounter (Signed)
Patient states that he had elevated liver enzymes over 32 years ago from a fatty liver from being overweight. States that he was seeing a specialist but stopped. He does not drink any alcohol.

## 2018-10-08 ENCOUNTER — Telehealth: Payer: Self-pay | Admitting: *Deleted

## 2018-10-08 NOTE — Telephone Encounter (Signed)
LMOVM for pt to return call 

## 2018-10-08 NOTE — Telephone Encounter (Signed)
Patient advised as below. Patient declined referral for work up. Patient reports he does not drink alcohol and reports that he has not been to a specialist in the past.

## 2018-10-08 NOTE — Telephone Encounter (Signed)
-----   Message from Adriana M Pollak, PA-C sent at 10/04/2018  1:13 PM EDT ----- His labs are stable however his liver enzymes are slightly high and have been for at least three years. Does he use alcohol? If so, he should decrease. Has he ever had these worked up before? If not, will take a second look at follow up. 

## 2018-10-19 ENCOUNTER — Other Ambulatory Visit: Payer: Self-pay | Admitting: Family Medicine

## 2018-10-19 DIAGNOSIS — E119 Type 2 diabetes mellitus without complications: Secondary | ICD-10-CM

## 2018-11-18 DIAGNOSIS — H524 Presbyopia: Secondary | ICD-10-CM | POA: Diagnosis not present

## 2018-11-18 DIAGNOSIS — E119 Type 2 diabetes mellitus without complications: Secondary | ICD-10-CM | POA: Diagnosis not present

## 2018-11-18 LAB — HM DIABETES EYE EXAM

## 2018-11-21 ENCOUNTER — Encounter: Payer: Self-pay | Admitting: Physician Assistant

## 2019-02-07 ENCOUNTER — Other Ambulatory Visit: Payer: Self-pay | Admitting: Physician Assistant

## 2019-02-07 DIAGNOSIS — E119 Type 2 diabetes mellitus without complications: Secondary | ICD-10-CM

## 2019-03-04 DIAGNOSIS — L718 Other rosacea: Secondary | ICD-10-CM | POA: Diagnosis not present

## 2019-03-28 ENCOUNTER — Encounter: Payer: Self-pay | Admitting: Physician Assistant

## 2019-03-28 ENCOUNTER — Ambulatory Visit: Payer: BC Managed Care – PPO | Admitting: Physician Assistant

## 2019-03-28 ENCOUNTER — Other Ambulatory Visit: Payer: Self-pay

## 2019-03-28 VITALS — BP 150/88 | HR 81 | Temp 96.9°F | Wt 188.4 lb

## 2019-03-28 DIAGNOSIS — L719 Rosacea, unspecified: Secondary | ICD-10-CM | POA: Diagnosis not present

## 2019-03-28 DIAGNOSIS — E119 Type 2 diabetes mellitus without complications: Secondary | ICD-10-CM

## 2019-03-28 DIAGNOSIS — Z23 Encounter for immunization: Secondary | ICD-10-CM | POA: Diagnosis not present

## 2019-03-28 DIAGNOSIS — Z1211 Encounter for screening for malignant neoplasm of colon: Secondary | ICD-10-CM

## 2019-03-28 LAB — POCT GLYCOSYLATED HEMOGLOBIN (HGB A1C)
Estimated Average Glucose: 180
Hemoglobin A1C: 7.9 % — AB (ref 4.0–5.6)

## 2019-03-28 MED ORDER — METFORMIN HCL 1000 MG PO TABS: 1000.0000 mg | ORAL_TABLET | Freq: Two times a day (BID) | ORAL | 1 refills | Status: DC

## 2019-03-28 MED ORDER — GLIPIZIDE 10 MG PO TABS: 10.0000 mg | ORAL_TABLET | Freq: Two times a day (BID) | ORAL | 1 refills | Status: DC

## 2019-03-28 NOTE — Progress Notes (Signed)
Patient: Alejandro Marquez Male    DOB: Sep 23, 1955   64 y.o.   MRN: [id number] Visit Date: 03/28/2019  Today's Provider: Trey Sailors, PA-C   Chief Complaint  Patient presents with  . Diabetes Mellitus   Subjective:     HPI  Hyperglycemia, Follow-up:   Lab Results  Component Value Date   HGBA1C 5.9 (A) 09/25/2018   HGBA1C 6.8 (A) 03/21/2018   HGBA1C 6.8 (A) 09/17/2017   GLUCOSE 126 (H) 10/03/2018   GLUCOSE 140 (H) 10/03/2017   GLUCOSE 322 (H) 06/13/2017    Last seen for for this 6 months ago.  Management since then includes no changes. Current symptoms include none and have been stable.  Weight trend: increasing steadily Prior visit with dietician: no Current diet: well balanced Current exercise: none  Pertinent Labs:    Component Value Date/Time   CHOL 64 (L) 10/03/2018 0911   TRIG 35 10/03/2018 0911   CHOLHDL 1.8 10/03/2018 0911   CHOLHDL 1.9 01/12/2017 0908   CREATININE 1.09 10/03/2018 0911   CREATININE 0.83 01/12/2017 0908    Wt Readings from Last 3 Encounters:  09/25/18 181 lb (82.1 kg)  03/21/18 190 lb 3.2 oz (86.3 kg)  09/17/17 187 lb 3.2 oz (84.9 kg)   BP Readings from Last 3 Encounters:  03/28/19 (!) 150/88  09/25/18 130/82  03/21/18 (!) 144/76    No Known Allergies   Current Outpatient Medications:  .  glipiZIDE (GLUCOTROL) 10 MG tablet, TAKE 1 TABLET (10 MG TOTAL) BY MOUTH 2 (TWO) TIMES DAILY BEFORE A MEAL., Disp: 60 tablet, Rfl: 2 .  metFORMIN (GLUCOPHAGE) 1000 MG tablet, Take 1 tablet (1,000 mg total) by mouth 2 (two) times daily with a meal., Disp: 60 tablet, Rfl: 0  Review of Systems  Social History   Tobacco Use  . Smoking status: Never Smoker  . Smokeless tobacco: Never Used  Substance Use Topics  . Alcohol use: Never      Objective:   There were no vitals taken for this visit. There were no vitals filed for this visit.There is no height or weight on file to calculate BMI.   Physical Exam Constitutional:       Appearance: Normal appearance.  Cardiovascular:     Rate and Rhythm: Normal rate and regular rhythm.     Heart sounds: Normal heart sounds.  Pulmonary:     Effort: Pulmonary effort is normal.     Breath sounds: Normal breath sounds.  Skin:    General: Skin is warm and dry.  Neurological:     Mental Status: He is alert and oriented to person, place, and time. Mental status is at baseline.  Psychiatric:        Mood and Affect: Mood normal.        Behavior: Behavior normal.      No results found for any visits on 03/28/19.     Assessment & Plan    1. Rosacea   2. Type 2 diabetes mellitus without complication, without long-term current use of insulin (HCC)  A1c increased today. Work on diet and follow up in 3 months. If still elevated, will add medicine.   - POCT HgB A1C - metFORMIN (GLUCOPHAGE) 1000 MG tablet; Take 1 tablet (1,000 mg total) by mouth 2 (two) times daily with a meal.  Dispense: 180 tablet; Refill: 1 - glipiZIDE (GLUCOTROL) 10 MG tablet; Take 1 tablet (10 mg total) by mouth 2 (two) times daily before a meal.  Dispense: 180 tablet; Refill: 1  3. Colon cancer screening  - Cologuard  4. Need for pneumococcal vaccination  - Pneumococcal polysaccharide vaccine 23-valent greater than or equal to 2yo subcutaneous/IM  The entirety of the information documented in the History of Present Illness, Review of Systems and Physical Exam were personally obtained by me. Portions of this information were initially documented by [facility] and reviewed by me for thoroughness and accuracy.      [name], PA-C  [facility]

## 2019-03-28 NOTE — Patient Instructions (Signed)
Diabetes Mellitus and Exercise Exercising regularly is important for your overall health, especially when you have diabetes (diabetes mellitus). Exercising is not only about losing weight. It has many other health benefits, such as increasing muscle strength and bone density and reducing body fat and stress. This leads to improved fitness, flexibility, and endurance, all of which result in better overall health. Exercise has additional benefits for people with diabetes, including:  Reducing appetite.  Helping to lower and control blood glucose.  Lowering blood pressure.  Helping to control amounts of fatty substances (lipids) in the blood, such as cholesterol and triglycerides.  Helping the body to respond better to insulin (improving insulin sensitivity).  Reducing how much insulin the body needs.  Decreasing the risk for heart disease by: ? Lowering cholesterol and triglyceride levels. ? Increasing the levels of good cholesterol. ? Lowering blood glucose levels. What is my activity plan? Your health care provider or certified diabetes educator can help you make a plan for the type and frequency of exercise (activity plan) that works for you. Make sure that you:  Do at least 150 minutes of moderate-intensity or vigorous-intensity exercise each week. This could be brisk walking, biking, or water aerobics. ? Do stretching and strength exercises, such as yoga or weightlifting, at least 2 times a week. ? Spread out your activity over at least 3 days of the week.  Get some form of physical activity every day. ? Do not go more than 2 days in a row without some kind of physical activity. ? Avoid being inactive for more than 30 minutes at a time. Take frequent breaks to walk or stretch.  Choose a type of exercise or activity that you enjoy, and set realistic goals.  Start slowly, and gradually increase the intensity of your exercise over time. What do I need to know about managing my  diabetes?   Check your blood glucose before and after exercising. ? If your blood glucose is 240 mg/dL (13.3 mmol/L) or higher before you exercise, check your urine for ketones. If you have ketones in your urine, do not exercise until your blood glucose returns to normal. ? If your blood glucose is 100 mg/dL (5.6 mmol/L) or lower, eat a snack containing 15-20 grams of carbohydrate. Check your blood glucose 15 minutes after the snack to make sure that your level is above 100 mg/dL (5.6 mmol/L) before you start your exercise.  Know the symptoms of low blood glucose (hypoglycemia) and how to treat it. Your risk for hypoglycemia increases during and after exercise. Common symptoms of hypoglycemia can include: ? Hunger. ? Anxiety. ? Sweating and feeling clammy. ? Confusion. ? Dizziness or feeling light-headed. ? Increased heart rate or palpitations. ? Blurry vision. ? Tingling or numbness around the mouth, lips, or tongue. ? Tremors or shakes. ? Irritability.  Keep a rapid-acting carbohydrate snack available before, during, and after exercise to help prevent or treat hypoglycemia.  Avoid injecting insulin into areas of the body that are going to be exercised. For example, avoid injecting insulin into: ? The arms, when playing tennis. ? The legs, when jogging.  Keep records of your exercise habits. Doing this can help you and your health care provider adjust your diabetes management plan as needed. Write down: ? Food that you eat before and after you exercise. ? Blood glucose levels before and after you exercise. ? The type and amount of exercise you have done. ? When your insulin is expected to peak, if you use   insulin. Avoid exercising at times when your insulin is peaking.  When you start a new exercise or activity, work with your health care provider to make sure the activity is safe for you, and to adjust your insulin, medicines, or food intake as needed.  Drink plenty of water while  you exercise to prevent dehydration or heat stroke. Drink enough fluid to keep your urine clear or pale yellow. Summary  Exercising regularly is important for your overall health, especially when you have diabetes (diabetes mellitus).  Exercising has many health benefits, such as increasing muscle strength and bone density and reducing body fat and stress.  Your health care provider or certified diabetes educator can help you make a plan for the type and frequency of exercise (activity plan) that works for you.  When you start a new exercise or activity, work with your health care provider to make sure the activity is safe for you, and to adjust your insulin, medicines, or food intake as needed. This information is not intended to replace advice given to you by your health care provider. Make sure you discuss any questions you have with your health care provider. Document Revised: 08/31/2016 Document Reviewed: 07/19/2015 Elsevier Patient Education  2020 Elsevier Inc.  

## 2019-06-25 ENCOUNTER — Encounter: Payer: Self-pay | Admitting: Physician Assistant

## 2019-06-25 ENCOUNTER — Ambulatory Visit: Payer: BC Managed Care – PPO | Admitting: Physician Assistant

## 2019-06-25 ENCOUNTER — Other Ambulatory Visit: Payer: Self-pay

## 2019-06-25 VITALS — BP 152/104 | Temp 97.1°F | Wt 182.4 lb

## 2019-06-25 DIAGNOSIS — E119 Type 2 diabetes mellitus without complications: Secondary | ICD-10-CM | POA: Diagnosis not present

## 2019-06-25 DIAGNOSIS — I10 Essential (primary) hypertension: Secondary | ICD-10-CM | POA: Diagnosis not present

## 2019-06-25 DIAGNOSIS — Z1211 Encounter for screening for malignant neoplasm of colon: Secondary | ICD-10-CM

## 2019-06-25 LAB — POCT GLYCOSYLATED HEMOGLOBIN (HGB A1C)
Est. average glucose Bld gHb Est-mCnc: 140
Hemoglobin A1C: 6.5 % — AB (ref 4.0–5.6)

## 2019-06-25 MED ORDER — LISINOPRIL 10 MG PO TABS: 10.0000 mg | ORAL_TABLET | Freq: Every day | ORAL | 0 refills | Status: DC

## 2019-06-25 NOTE — Progress Notes (Signed)
Established patient visit   Patient: Alejandro Marquez   DOB: 10/21/1955   64 y.o. Male  MRN: [id number] Visit Date: 06/25/2019  Today's healthcare provider: [name], PA-C   Chief Complaint  Patient presents with  . Diabetes  . Hypertension  I,Porsha C McClurkin,acting as a scribe for [employer], PA-C.,have documented all relevant documentation on the behalf of [name], PA-C,as directed by  [name], PA-C while in the presence of [name], PA-C.  Subjective    HPI Diabetes Mellitus Type II, Follow-up  Lab Results  Component Value Date   HGBA1C 6.5 (A) 06/25/2019   HGBA1C 7.9 (A) 03/28/2019   HGBA1C 5.9 (A) 09/25/2018   Last seen for diabetes 9 months ago.  Management since then includes advising patient to work on diet and follow up in 3 months. If still elevated, will add medicine.  He reports good compliance with treatment. He is not having side effects.  Symptoms: No fatigue No foot ulcerations No appetite changes No nausea No paresthesia (numbness or tingling) of the feet  No polydipsia (excessive thirst) No polyuria (frequent urination) No visual disturbances  No vomiting  Home blood sugar records: not being checked.  Episodes of hypoglycemia? No    Current insulin regiment: none Most Recent Eye Exam: 11/18/2018 Current exercise: walking Current diet habits: well balanced  Pertinent Labs: Lab Results  Component Value Date   CHOL 64 (L) 10/03/2018   HDL 35 (L) 10/03/2018   LDLCALC 22 10/03/2018   TRIG 35 10/03/2018   CHOLHDL 1.8 10/03/2018   Lab Results  Component Value Date   NA 142 10/03/2018   K 4.2 10/03/2018   CO2 24 10/03/2018   GLUCOSE 126 (H) 10/03/2018   BUN 13 10/03/2018   CREATININE 1.09 10/03/2018   CALCIUM 9.3 10/03/2018   GFRNONAA 73 10/03/2018   GFRAA 84 10/03/2018     Wt Readings from Last 3 Encounters:  06/25/19 182 lb 6.4 oz (82.7 kg)  03/28/19 188 lb 6.4 oz (85.5 kg)   09/25/18 181 lb (82.1 kg)   Hypertension, follow-up  BP Readings from Last 3 Encounters:  06/25/19 (!) 152/104  03/28/19 (!) 150/88  09/25/18 130/82   Wt Readings from Last 3 Encounters:  06/25/19 182 lb 6.4 oz (82.7 kg)  03/28/19 188 lb 6.4 oz (85.5 kg)  09/25/18 181 lb (82.1 kg)     Not currently on medications.  Use of agents associated with hypertension: none.   Outside blood pressures are not checked. Symptoms: No chest pain No chest pressure No palpitations No dyspnea No orthopnea No paroxysmal nocturnal dyspnea No lower extremity edema No syncope   Pertinent labs: Lab Results  Component Value Date   CHOL 64 (L) 10/03/2018   HDL 35 (L) 10/03/2018   LDLCALC 22 10/03/2018   TRIG 35 10/03/2018   CHOLHDL 1.8 10/03/2018   Lab Results  Component Value Date   NA 142 10/03/2018   K 4.2 10/03/2018   CO2 24 10/03/2018   GLUCOSE 126 (H) 10/03/2018   BUN 13 10/03/2018   CREATININE 1.09 10/03/2018   CALCIUM 9.3 10/03/2018   GFRNONAA 73 10/03/2018   GFRAA 84 10/03/2018     The ASCVD Risk score (Goff DC Jr., et al., 2013) failed to calculate for the following reasons:   The valid total cholesterol range is 130 to 320 mg/dL   ---------------------------------------------------------------------------------------------------   --------------------------------------------------------------------------------------------------- Elevated blood pressure at visit today,06/25/2019 and last office visit  on 03/28/2019.      Medications: Outpatient Medications Prior to Visit  Medication Sig  . glipiZIDE (GLUCOTROL) 10 MG tablet Take 1 tablet (10 mg total) by mouth 2 (two) times daily before a meal.  . metFORMIN (GLUCOPHAGE) 1000 MG tablet Take 1 tablet (1,000 mg total) by mouth 2 (two) times daily with a meal.   No facility-administered medications prior to visit.    Review of Systems  Constitutional: Negative for appetite change, chills and fever.  Respiratory:  Negative for chest tightness, shortness of breath and wheezing.   Cardiovascular: Negative for chest pain and palpitations.  Gastrointestinal: Negative for abdominal pain, nausea and vomiting.  Neurological: Negative.   Hematological: Negative.       Objective    BP (!) 152/104 (BP Location: Right Arm, Patient Position: Sitting, Cuff Size: Normal)   Temp (!) 97.1 F (36.2 C) (Temporal)   Wt 182 lb 6.4 oz (82.7 kg)   BMI 25.44 kg/m    Physical Exam Constitutional:      Appearance: Normal appearance.  Cardiovascular:     Rate and Rhythm: Normal rate and regular rhythm.     Pulses: Normal pulses.     Heart sounds: Normal heart sounds.  Pulmonary:     Effort: Pulmonary effort is normal.     Breath sounds: Normal breath sounds.  Skin:    General: Skin is warm and dry.  Neurological:     General: No focal deficit present.     Mental Status: He is alert and oriented to person, place, and time.  Psychiatric:        Mood and Affect: Mood normal.        Behavior: Behavior normal.       Results for orders placed or performed in visit on 06/25/19  POCT glycosylated hemoglobin (Hb A1C)  Result Value Ref Range   Hemoglobin A1C 6.5 (A) 4.0 - 5.6 %   HbA1c POC (<> result, manual entry)     HbA1c, POC (prediabetic range)     HbA1c, POC (controlled diabetic range)     Est. average glucose Bld gHb Est-mCnc 140     Assessment & Plan    1. Type 2 diabetes mellitus without complication, without long-term current use of insulin (HCC) Well controlled with last A1c 6.5 Continue current medications UTD on vaccines, eye exam, foot exam Discussed diet and exercise F/u in 3 months  - POCT glycosylated hemoglobin (Hb A1C) - Comprehensive metabolic panel - Lipid panel  2. Hypertension, unspecified type Patient bp was elevated today,06/25/2019 and at previous visit. Patient will be started on Lisinopril as listed below. - lisinopril (ZESTRIL) 10 MG tablet; Take 1 tablet (10 mg total) by  mouth daily.  Dispense: 90 tablet; Refill: 0 - Comprehensive metabolic panel - Lipid panel   3. Colon Cancer Screening Patient has received his cologuard kit but declines the test. Patient also refuses colonoscopy.   Return in about 3 months (around 09/25/2019) for DM and HTN.      ITrinna Post, PA-C, have reviewed all documentation for this visit. The documentation on 06/25/19 for the exam, diagnosis, procedures, and orders are all accurate and complete.    Paulene Floor  [facility] [phone] (phone) [phone] (fax)  [place]

## 2019-06-26 ENCOUNTER — Telehealth: Payer: Self-pay

## 2019-06-26 LAB — LIPID PANEL
Chol/HDL Ratio: 2 ratio (ref 0.0–5.0)
Cholesterol, Total: 69 mg/dL — ABNORMAL LOW (ref 100–199)
HDL: 34 mg/dL — ABNORMAL LOW (ref >39)
LDL Chol Calc (NIH): 15 mg/dL (ref 0–99)
Triglycerides: 104 mg/dL (ref 0–149)
VLDL Cholesterol Cal: 20 mg/dL (ref 5–40)

## 2019-06-26 LAB — COMPREHENSIVE METABOLIC PANEL
ALT: 48 IU/L — ABNORMAL HIGH (ref 0–44)
AST: 45 IU/L — ABNORMAL HIGH (ref 0–40)
Albumin/Globulin Ratio: 1.5 (ref 1.2–2.2)
Albumin: 4.5 g/dL (ref 3.8–4.8)
Alkaline Phosphatase: 79 IU/L (ref 39–117)
BUN/Creatinine Ratio: 14 (ref 10–24)
BUN: 14 mg/dL (ref 8–27)
Bilirubin Total: 0.6 mg/dL (ref 0.0–1.2)
CO2: 24 mmol/L (ref 20–29)
Calcium: 9.7 mg/dL (ref 8.6–10.2)
Chloride: 103 mmol/L (ref 96–106)
Creatinine, Ser: 0.98 mg/dL (ref 0.76–1.27)
GFR calc Af Amer: 96 mL/min/{1.73_m2} (ref >59)
GFR calc non Af Amer: 83 mL/min/{1.73_m2} (ref >59)
Globulin, Total: 3.1 g/dL (ref 1.5–4.5)
Glucose: 150 mg/dL — ABNORMAL HIGH (ref 65–99)
Potassium: 4.1 mmol/L (ref 3.5–5.2)
Sodium: 141 mmol/L (ref 134–144)
Total Protein: 7.6 g/dL (ref 6.0–8.5)

## 2019-06-26 NOTE — Telephone Encounter (Signed)
-----   Message from Trey Sailors, [place] sent at 06/26/2019  9:21 AM EDT ----- Labs are stable with slightly elevated liver enzymes.

## 2019-06-26 NOTE — Telephone Encounter (Signed)
Called the patient and no answer, left voicemail for patient to call back. If patient returns call okay for [facility] to advise patient of lab results.

## 2019-06-27 NOTE — Telephone Encounter (Signed)
Attempted to call pt.  Left vm to return call to office to discuss lab results.  

## 2019-06-30 NOTE — Telephone Encounter (Signed)
Patient came in the office today, 06/30/2019 for labs.

## 2019-09-17 ENCOUNTER — Other Ambulatory Visit: Payer: Self-pay | Admitting: Physician Assistant

## 2019-09-17 DIAGNOSIS — I10 Essential (primary) hypertension: Secondary | ICD-10-CM

## 2019-09-17 NOTE — Telephone Encounter (Signed)
Requested Prescriptions  Pending Prescriptions Disp Refills  . lisinopril (ZESTRIL) 10 MG tablet [Pharmacy Med Name: LISINOPRIL 10 MG TABLET] 90 tablet 1    Sig: TAKE 1 TABLET BY MOUTH EVERY DAY     Cardiovascular:  ACE Inhibitors Failed - 09/17/2019  1:30 AM      Failed - Last BP in normal range    BP Readings from Last 1 Encounters:  06/25/19 (!) 152/104         Passed - Cr in normal range and within 180 days    Creat  Date Value Ref Range Status  01/12/2017 0.83 0.70 - 1.33 mg/dL Final    Comment:    For patients >49 years of age, the reference limit for Creatinine is approximately 13% higher for people identified as African-American. .    Creatinine, Ser  Date Value Ref Range Status  06/25/2019 0.98 0.76 - 1.27 mg/dL Final         Passed - K in normal range and within 180 days    Potassium  Date Value Ref Range Status  06/25/2019 4.1 3.5 - 5.2 mmol/L Final         Passed - Patient is not pregnant      Passed - Valid encounter within last 6 months    Recent Outpatient Visits          2 months ago Type 2 diabetes mellitus without complication, without long-term current use of insulin [facility])   [facility] [place], [place], PA-C   5 months ago Colon cancer screening   [facility] Osvaldo Angst M, PA-C   11 months ago Type 2 diabetes mellitus without complication, without long-term current use of insulin [facility])   [facility] [place], [place], PA-C   1 year ago Type 2 diabetes mellitus without complication, without long-term current use of insulin [facility])   [facility] [place], [place], [name]   2 years ago Type 2 diabetes mellitus without complication, without long-term current use of insulin [facility])   [facility], Molly [place], [name]      Future Appointments            In 1 week Trey Sailors, PA-C [employer], [facility]

## 2019-09-22 NOTE — Progress Notes (Signed)
Established patient visit   Patient: Alejandro Marquez   DOB: 11/27/1955   64 y.o. Male  MRN: [id number] Visit Date: 09/25/2019  Today's healthcare provider: Trey Sailors, PA-C   Chief Complaint  Patient presents with  . Diabetes  . Hypertension  I,Porsha C McClurkin,acting as a scribe for [employer], PA-C.,have documented all relevant documentation on the behalf of Trey Sailors, PA-C,as directed by  Trey Sailors, PA-C while in the presence of Trey Sailors, PA-C.  Subjective    HPI   Hypertension, follow-up  BP Readings from Last 3 Encounters:  09/25/19 116/62  06/25/19 (!) 152/104  03/28/19 (!) 150/88   Wt Readings from Last 3 Encounters:  09/25/19 179 lb 12.8 oz (81.6 kg)  06/25/19 182 lb 6.4 oz (82.7 kg)  03/28/19 188 lb 6.4 oz (85.5 kg)     He was last seen for hypertension 3 months ago.  BP at that visit was 152/104. Management since that visit includes Lisinopril 10 MG .  He reports good compliance with treatment. He is not having side effects.  He is following a Regular, Low Sodium diet. He is exercising. He does not smoke.  Use of agents associated with hypertension: none.   Outside blood pressures are not being checked.  --------------------------------------------------------------------------------------------------- Diabetes Mellitus Type II, Follow-up  Lab Results  Component Value Date   HGBA1C 6.5 (A) 06/25/2019   HGBA1C 7.9 (A) 03/28/2019   HGBA1C 5.9 (A) 09/25/2018   Wt Readings from Last 3 Encounters:  09/25/19 179 lb 12.8 oz (81.6 kg)  06/25/19 182 lb 6.4 oz (82.7 kg)  03/28/19 188 lb 6.4 oz (85.5 kg)   Last seen for diabetes 3 months ago.  Management since then includes no changes. He reports good compliance with treatment. He is not having side effects.   Home blood sugar records: not being checked  Episodes of hypoglycemia? No    Current insulin regiment: NONE Most Recent Eye Exam: 11/18/2018 Current  exercise: walking Current diet habits: well balanced, low salt  ---------------------------------------------------------------------------------------------------     Medications: Outpatient Medications Prior to Visit  Medication Sig  . glipiZIDE (GLUCOTROL) 10 MG tablet Take 1 tablet (10 mg total) by mouth 2 (two) times daily before a meal.  . lisinopril (ZESTRIL) 10 MG tablet TAKE 1 TABLET BY MOUTH EVERY DAY  . metFORMIN (GLUCOPHAGE) 1000 MG tablet Take 1 tablet (1,000 mg total) by mouth 2 (two) times daily with a meal.   No facility-administered medications prior to visit.    Review of Systems    Objective    BP 116/62 (BP Location: Left Arm, Patient Position: Sitting, Cuff Size: Normal)   Pulse 89   Temp 98.3 F (36.8 C) (Oral)   Wt 179 lb 12.8 oz (81.6 kg)   SpO2 98%   BMI 25.08 kg/m    Physical Exam Constitutional:      Appearance: Normal appearance. He is normal weight.  Cardiovascular:     Rate and Rhythm: Normal rate and regular rhythm.     Heart sounds: Normal heart sounds.  Pulmonary:     Effort: Pulmonary effort is normal.     Breath sounds: Normal breath sounds.  Feet:     Comments: Decrease in sensation bilateral toes. Skin:    General: Skin is warm and dry.  Neurological:     General: No focal deficit present.     Mental Status: He is alert and oriented to person, place, and time. Mental  status is at baseline.  Psychiatric:        Mood and Affect: Mood normal.        Behavior: Behavior normal.       No results found for any visits on 09/25/19.  Assessment & Plan    1. Type 2 diabetes mellitus without complication, without long-term current use of insulin (HCC)  Well controlled with last A1c 6.4% Continue current medications UTD on vaccines, eye exam, foot exam On ACEi Declines statin. LDL is extremely low at 15. Discussed diet and exercise F/u in 6 months  - POCT HgB A1C - Comprehensive metabolic panel  2. Hypertension,  unspecified type  Well controlled Continue current medications Recheck metabolic panel F/u in 6 months  - Comprehensive metabolic panel    Return in about 6 months (around 03/27/2020) for DM,HTN.      ITrey Sailors, PA-C, have reviewed all documentation for this visit. The documentation on 10/21/19 for the exam, diagnosis, procedures, and orders are all accurate and complete.  The entirety of the information documented in the History of Present Illness, Review of Systems and Physical Exam were personally obtained by me. Portions of this information were initially documented by [facility] and reviewed by me for thoroughness and accuracy.     Maryella Shivers  [facility] [phone] (phone) [phone] (fax)  [facility]

## 2019-09-25 ENCOUNTER — Encounter: Payer: Self-pay | Admitting: Physician Assistant

## 2019-09-25 ENCOUNTER — Other Ambulatory Visit: Payer: Self-pay

## 2019-09-25 ENCOUNTER — Ambulatory Visit: Payer: BC Managed Care – PPO | Admitting: Physician Assistant

## 2019-09-25 VITALS — BP 116/62 | HR 89 | Temp 98.3°F | Wt 179.8 lb

## 2019-09-25 DIAGNOSIS — E119 Type 2 diabetes mellitus without complications: Secondary | ICD-10-CM | POA: Diagnosis not present

## 2019-09-25 DIAGNOSIS — I10 Essential (primary) hypertension: Secondary | ICD-10-CM | POA: Diagnosis not present

## 2019-09-25 LAB — POCT GLYCOSYLATED HEMOGLOBIN (HGB A1C)
Est. average glucose Bld gHb Est-mCnc: 137
Hemoglobin A1C: 6.4 % — AB (ref 4.0–5.6)

## 2019-09-25 NOTE — Patient Instructions (Signed)
Diabetes Mellitus and Exercise Exercising regularly is important for your overall health, especially when you have diabetes (diabetes mellitus). Exercising is not only about losing weight. It has many other health benefits, such as increasing muscle strength and bone density and reducing body fat and stress. This leads to improved fitness, flexibility, and endurance, all of which result in better overall health. Exercise has additional benefits for people with diabetes, including:  Reducing appetite.  Helping to lower and control blood glucose.  Lowering blood pressure.  Helping to control amounts of fatty substances (lipids) in the blood, such as cholesterol and triglycerides.  Helping the body to respond better to insulin (improving insulin sensitivity).  Reducing how much insulin the body needs.  Decreasing the risk for heart disease by: ? Lowering cholesterol and triglyceride levels. ? Increasing the levels of good cholesterol. ? Lowering blood glucose levels. What is my activity plan? Your health care provider or certified diabetes educator can help you make a plan for the type and frequency of exercise (activity plan) that works for you. Make sure that you:  Do at least 150 minutes of moderate-intensity or vigorous-intensity exercise each week. This could be brisk walking, biking, or water aerobics. ? Do stretching and strength exercises, such as yoga or weightlifting, at least 2 times a week. ? Spread out your activity over at least 3 days of the week.  Get some form of physical activity every day. ? Do not go more than 2 days in a row without some kind of physical activity. ? Avoid being inactive for more than 30 minutes at a time. Take frequent breaks to walk or stretch.  Choose a type of exercise or activity that you enjoy, and set realistic goals.  Start slowly, and gradually increase the intensity of your exercise over time. What do I need to know about managing my  diabetes?   Check your blood glucose before and after exercising. ? If your blood glucose is 240 mg/dL (13.3 mmol/L) or higher before you exercise, check your urine for ketones. If you have ketones in your urine, do not exercise until your blood glucose returns to normal. ? If your blood glucose is 100 mg/dL (5.6 mmol/L) or lower, eat a snack containing 15-20 grams of carbohydrate. Check your blood glucose 15 minutes after the snack to make sure that your level is above 100 mg/dL (5.6 mmol/L) before you start your exercise.  Know the symptoms of low blood glucose (hypoglycemia) and how to treat it. Your risk for hypoglycemia increases during and after exercise. Common symptoms of hypoglycemia can include: ? Hunger. ? Anxiety. ? Sweating and feeling clammy. ? Confusion. ? Dizziness or feeling light-headed. ? Increased heart rate or palpitations. ? Blurry vision. ? Tingling or numbness around the mouth, lips, or tongue. ? Tremors or shakes. ? Irritability.  Keep a rapid-acting carbohydrate snack available before, during, and after exercise to help prevent or treat hypoglycemia.  Avoid injecting insulin into areas of the body that are going to be exercised. For example, avoid injecting insulin into: ? The arms, when playing tennis. ? The legs, when jogging.  Keep records of your exercise habits. Doing this can help you and your health care provider adjust your diabetes management plan as needed. Write down: ? Food that you eat before and after you exercise. ? Blood glucose levels before and after you exercise. ? The type and amount of exercise you have done. ? When your insulin is expected to peak, if you use   insulin. Avoid exercising at times when your insulin is peaking.  When you start a new exercise or activity, work with your health care provider to make sure the activity is safe for you, and to adjust your insulin, medicines, or food intake as needed.  Drink plenty of water while  you exercise to prevent dehydration or heat stroke. Drink enough fluid to keep your urine clear or pale yellow. Summary  Exercising regularly is important for your overall health, especially when you have diabetes (diabetes mellitus).  Exercising has many health benefits, such as increasing muscle strength and bone density and reducing body fat and stress.  Your health care provider or certified diabetes educator can help you make a plan for the type and frequency of exercise (activity plan) that works for you.  When you start a new exercise or activity, work with your health care provider to make sure the activity is safe for you, and to adjust your insulin, medicines, or food intake as needed. This information is not intended to replace advice given to you by your health care provider. Make sure you discuss any questions you have with your health care provider. Document Revised: 08/31/2016 Document Reviewed: 07/19/2015 Elsevier Patient Education  2020 Elsevier Inc.  

## 2019-09-26 ENCOUNTER — Telehealth: Payer: Self-pay

## 2019-09-26 LAB — COMPREHENSIVE METABOLIC PANEL
ALT: 40 IU/L (ref 0–44)
AST: 34 IU/L (ref 0–40)
Albumin/Globulin Ratio: 1.8 (ref 1.2–2.2)
Albumin: 4.6 g/dL (ref 3.8–4.8)
Alkaline Phosphatase: 74 IU/L (ref 48–121)
BUN/Creatinine Ratio: 15 (ref 10–24)
BUN: 15 mg/dL (ref 8–27)
Bilirubin Total: 0.7 mg/dL (ref 0.0–1.2)
CO2: 26 mmol/L (ref 20–29)
Calcium: 9.8 mg/dL (ref 8.6–10.2)
Chloride: 106 mmol/L (ref 96–106)
Creatinine, Ser: 1.01 mg/dL (ref 0.76–1.27)
GFR calc Af Amer: 92 mL/min/{1.73_m2} (ref >59)
GFR calc non Af Amer: 79 mL/min/{1.73_m2} (ref >59)
Globulin, Total: 2.6 g/dL (ref 1.5–4.5)
Glucose: 147 mg/dL — ABNORMAL HIGH (ref 65–99)
Potassium: 4.3 mmol/L (ref 3.5–5.2)
Sodium: 145 mmol/L — ABNORMAL HIGH (ref 134–144)
Total Protein: 7.2 g/dL (ref 6.0–8.5)

## 2019-09-26 NOTE — Telephone Encounter (Signed)
Patient advised of lab results

## 2019-09-26 NOTE — Telephone Encounter (Signed)
-----   Message from Trey Sailors, [place] sent at 09/26/2019  8:18 AM EDT ----- Labs stable after starting lisinopril. See him at follow up.

## 2019-11-16 ENCOUNTER — Other Ambulatory Visit: Payer: Self-pay | Admitting: Physician Assistant

## 2019-11-16 DIAGNOSIS — E119 Type 2 diabetes mellitus without complications: Secondary | ICD-10-CM

## 2019-11-16 NOTE — Telephone Encounter (Signed)
Requested Prescriptions  Pending Prescriptions Disp Refills  . glipiZIDE (GLUCOTROL) 10 MG tablet [Pharmacy Med Name: GLIPIZIDE 10 MG TABLET] 60 tablet 5    Sig: TAKE 1 TABLET (10 MG TOTAL) BY MOUTH 2 (TWO) TIMES DAILY BEFORE A MEAL.     Endocrinology:  Diabetes - Sulfonylureas Passed - 11/16/2019 12:50 AM      Passed - HBA1C is between 0 and 7.9 and within 180 days    Hemoglobin A1C  Date Value Ref Range Status  09/25/2019 6.4 (A) 4.0 - 5.6 % Final   Hgb A1c MFr Bld  Date Value Ref Range Status  06/16/2014 6.9 (A) 4.0 - 6.0 % Final    Comment:    eAG 151         Passed - Valid encounter within last 6 months    Recent Outpatient Visits          1 month ago Type 2 diabetes mellitus without complication, without long-term current use of insulin [facility])   [facility] [place], Adriana M, PA-C   4 months ago Type 2 diabetes mellitus without complication, without long-term current use of insulin [facility])   [facility] [place], [place], [place]   7 months ago Colon cancer screening   [facility] [place], Ricki Rodriguez M, PA-C   1 year ago Type 2 diabetes mellitus without complication, without long-term current use of insulin [facility])   [facility] [place], [place], PA-C   1 year ago Type 2 diabetes mellitus without complication, without long-term current use of insulin [facility])   [facility] [place], Molly [place], [name]      Future Appointments            In 4 months Jodi Marble, Lavella [place], PA-C [employer], [facility]

## 2019-12-04 DIAGNOSIS — E119 Type 2 diabetes mellitus without complications: Secondary | ICD-10-CM | POA: Diagnosis not present

## 2019-12-04 DIAGNOSIS — H524 Presbyopia: Secondary | ICD-10-CM | POA: Diagnosis not present

## 2019-12-04 LAB — HM DIABETES EYE EXAM

## 2020-02-04 ENCOUNTER — Other Ambulatory Visit: Payer: Self-pay | Admitting: Physician Assistant

## 2020-02-04 DIAGNOSIS — E119 Type 2 diabetes mellitus without complications: Secondary | ICD-10-CM

## 2020-02-04 NOTE — Telephone Encounter (Signed)
Requested medication (s) are due for refill today: Yes  Requested medication (s) are on the active medication list: Yes  Last refill:  03/28/19  Future visit scheduled: Yes  Notes to clinic:  Pharmacy asking for diagnosis code.    Requested Prescriptions  Pending Prescriptions Disp Refills   metFORMIN (GLUCOPHAGE) 1000 MG tablet [Pharmacy Med Name: METFORMIN HCL 1,000 MG TABLET] 180 tablet 1    Sig: Take 1 tablet (1,000 mg total) by mouth 2 (two) times daily with a meal.      Endocrinology:  Diabetes - Biguanides Passed - 02/04/2020  1:52 PM      Passed - Cr in normal range and within 360 days    Creat  Date Value Ref Range Status  01/12/2017 0.83 0.70 - 1.33 mg/dL Final    Comment:    For patients >76 years of age, the reference limit for Creatinine is approximately 13% higher for people identified as African-American. .    Creatinine, Ser  Date Value Ref Range Status  09/25/2019 1.01 0.76 - 1.27 mg/dL Final          Passed - HBA1C is between 0 and 7.9 and within 180 days    Hemoglobin A1C  Date Value Ref Range Status  09/25/2019 6.4 (A) 4.0 - 5.6 % Final   Hgb A1c MFr Bld  Date Value Ref Range Status  06/16/2014 6.9 (A) 4.0 - 6.0 % Final    Comment:    eAG 151          Passed - eGFR in normal range and within 360 days    GFR, Est African American  Date Value Ref Range Status  01/12/2017 112 > OR = 60 mL/min/1.53m Final   GFR calc Af Amer  Date Value Ref Range Status  09/25/2019 92 >59 mL/min/1.73 Final    Comment:    **[facility] currently reports eGFR in compliance with the current**   recommendations of the [employer] [facility] will   update reporting as new guidelines are published from the NKF-ASN   Task force.    GFR, Est Non African American  Date Value Ref Range Status  01/12/2017 96 > OR = 60 mL/min/1.754mFinal   GFR calc non Af Amer  Date Value Ref Range Status  09/25/2019 79 >59 mL/min/1.73 Final          Passed - Valid  encounter within last 6 months    Recent Outpatient Visits           4 months ago Type 2 diabetes mellitus without complication, without long-term current use of insulin ([facility]  [place]   7 months ago Type 2 diabetes mellitus without complication, without long-term current use of insulin ([facility]  [facility] [place] [place], [place] 10 months ago Colon cancer screening   [facility]   1 year ago Type 2 diabetes mellitus without complication, without long-term current use of insulin ([facility]  [facility]   1 year ago Type 2 diabetes mellitus without complication, without long-term current use of insulin ([facility]  [facility] [place]     Future Appointments             In 1 month PoTerrilee CroakAdWendee BeaversPA-C [facility]

## 2020-02-06 NOTE — Telephone Encounter (Signed)
L.O.V. was on 09/25/2019 and upcoming appointment was on 03/29/2020. Medication refill send into the pharmacy.

## 2020-03-12 ENCOUNTER — Other Ambulatory Visit: Payer: Self-pay | Admitting: Physician Assistant

## 2020-03-12 DIAGNOSIS — I10 Essential (primary) hypertension: Secondary | ICD-10-CM

## 2020-03-29 ENCOUNTER — Ambulatory Visit: Payer: Self-pay | Admitting: Physician Assistant

## 2020-03-31 ENCOUNTER — Ambulatory Visit: Payer: BC Managed Care – PPO | Admitting: Physician Assistant

## 2020-03-31 ENCOUNTER — Encounter: Payer: Self-pay | Admitting: Physician Assistant

## 2020-03-31 ENCOUNTER — Other Ambulatory Visit: Payer: Self-pay

## 2020-03-31 VITALS — BP 130/85 | HR 78 | Temp 97.9°F | Wt 181.4 lb

## 2020-03-31 DIAGNOSIS — I10 Essential (primary) hypertension: Secondary | ICD-10-CM | POA: Insufficient documentation

## 2020-03-31 DIAGNOSIS — E119 Type 2 diabetes mellitus without complications: Secondary | ICD-10-CM

## 2020-03-31 DIAGNOSIS — Z23 Encounter for immunization: Secondary | ICD-10-CM

## 2020-03-31 DIAGNOSIS — Z2821 Immunization not carried out because of patient refusal: Secondary | ICD-10-CM

## 2020-03-31 DIAGNOSIS — E1159 Type 2 diabetes mellitus with other circulatory complications: Secondary | ICD-10-CM

## 2020-03-31 DIAGNOSIS — I152 Hypertension secondary to endocrine disorders: Secondary | ICD-10-CM

## 2020-03-31 DIAGNOSIS — E785 Hyperlipidemia, unspecified: Secondary | ICD-10-CM | POA: Insufficient documentation

## 2020-03-31 DIAGNOSIS — E1169 Type 2 diabetes mellitus with other specified complication: Secondary | ICD-10-CM

## 2020-03-31 LAB — POCT GLYCOSYLATED HEMOGLOBIN (HGB A1C): Hemoglobin A1C: 6.7 % — AB (ref 4.0–5.6)

## 2020-03-31 NOTE — Patient Instructions (Signed)
Diabetes Mellitus and Exercise Exercising regularly is important for overall health, especially for people who have diabetes mellitus. Exercising is not only about losing weight. It has many other health benefits, such as increasing muscle strength and bone density and reducing body fat and stress. This leads to improved fitness, flexibility, and endurance, all of which result in better overall health. What are the benefits of exercise if I have diabetes? Exercise has many benefits for people with diabetes. They include:  Helping to lower and control blood sugar (glucose).  Helping the body to respond better to the hormone insulin by improving insulin sensitivity.  Reducing how much insulin the body needs.  Lowering the risk for heart disease by: ? Lowering "bad" cholesterol and triglyceride levels. ? Increasing "good" cholesterol levels. ? Lowering blood pressure. ? Lowering blood glucose levels. What is my activity plan? Your health care provider or certified diabetes educator can help you make a plan for the type and frequency of exercise that works for you. This is called your activity plan. Be sure to:  Get at least 150 minutes of medium-intensity or high-intensity exercise each week. Exercises may include brisk walking, biking, or water aerobics.  Do stretching and strengthening exercises, such as yoga or weight lifting, at least 2 times a week.  Spread out your activity over at least 3 days of the week.  Get some form of physical activity each day. ? Do not go more than 2 days in a row without some kind of physical activity. ? Avoid being inactive for more than 90 minutes at a time. Take frequent breaks to walk or stretch.  Choose exercises or activities that you enjoy. Set realistic goals.  Start slowly and gradually increase your exercise intensity over time.   How do I manage my diabetes during exercise? Monitor your blood glucose  Check your blood glucose before and  after exercising. If your blood glucose is: ? 240 mg/dL (13.3 mmol/L) or higher before you exercise, check your urine for ketones. These are chemicals created by the liver. If you have ketones in your urine, do not exercise until your blood glucose returns to normal. ? 100 mg/dL (5.6 mmol/L) or lower, eat a snack containing 15-20 grams of carbohydrate. Check your blood glucose 15 minutes after the snack to make sure that your glucose level is above 100 mg/dL (5.6 mmol/L) before you start your exercise.  Know the symptoms of low blood glucose (hypoglycemia) and how to treat it. Your risk for hypoglycemia increases during and after exercise. Follow these tips and your health care provider's instructions  Keep a carbohydrate snack that is fast-acting for use before, during, and after exercise to help prevent or treat hypoglycemia.  Avoid injecting insulin into areas of the body that are going to be exercised. For example, avoid injecting insulin into: ? Your arms, when you are about to play tennis. ? Your legs, when you are about to go jogging.  Keep records of your exercise habits. Doing this can help you and your health care provider adjust your diabetes management plan as needed. Write down: ? Food that you eat before and after you exercise. ? Blood glucose levels before and after you exercise. ? The type and amount of exercise you have done.  Work with your health care provider when you start a new exercise or activity. He or she may need to: ? Make sure that the activity is safe for you. ? Adjust your insulin, other medicines, and food that   you eat.  Drink plenty of water while you exercise. This prevents loss of water (dehydration) and problems caused by a lot of heat in the body (heat stroke).   Where to find more information  American Diabetes Association: www.diabetes.org Summary  Exercising regularly is important for overall health, especially for people who have diabetes  mellitus.  Exercising has many health benefits. It increases muscle strength and bone density and reduces body fat and stress. It also lowers and controls blood glucose.  Your health care provider or certified diabetes educator can help you make an activity plan for the type and frequency of exercise that works for you.  Work with your health care provider to make sure any new activity is safe for you. Also work with your health care provider to adjust your insulin, other medicines, and the food you eat. This information is not intended to replace advice given to you by your health care provider. Make sure you discuss any questions you have with your health care provider. Document Revised: 11/04/2018 Document Reviewed: 11/04/2018 Elsevier Patient Education  2021 Elsevier Inc.  

## 2020-03-31 NOTE — Progress Notes (Signed)
Established patient visit   Patient: Alejandro Marquez   DOB: Jul 27, 1955   64 y.o. Male  MRN: [id number] Visit Date: 03/31/2020  Today's healthcare provider: Trey Sailors, PA-C   Chief Complaint  Patient presents with  . Diabetes  . Hypertension   Subjective    HPI  Diabetes Mellitus Type II, follow-up  Lab Results  Component Value Date   HGBA1C 6.7 (A) 03/31/2020   HGBA1C 6.4 (A) 09/25/2019   HGBA1C 6.5 (A) 06/25/2019   Last seen for diabetes 6 months ago.  Management since then includes continuing the same treatment. He reports good compliance with treatment. He is not having side effects.   Home blood sugar records: not being checked  Episodes of hypoglycemia? No    Current insulin regiment: None Most Recent Eye Exam: 12/04/2019  --------------------------------------------------------------------------------------------------- Hypertension, follow-up  BP Readings from Last 3 Encounters:  03/31/20 130/85  09/25/19 116/62  06/25/19 (!) 152/104   Wt Readings from Last 3 Encounters:  03/31/20 181 lb 6.4 oz (82.3 kg)  09/25/19 179 lb 12.8 oz (81.6 kg)  06/25/19 182 lb 6.4 oz (82.7 kg)     He was last seen for hypertension 6 months ago.  BP at that visit was 116/62. Management since that visit includes continue current medication. He reports good compliance with treatment. He is not having side effects.  He is not exercising. He is adherent to low salt diet.   Outside blood pressures are not being checked.  He does not smoke.  Use of agents associated with hypertension: none.   ---------------------------------------------------------------------------------------------------       Medications: Outpatient Medications Prior to Visit  Medication Sig  . glipiZIDE (GLUCOTROL) 10 MG tablet TAKE 1 TABLET (10 MG TOTAL) BY MOUTH 2 (TWO) TIMES DAILY BEFORE A MEAL.  . metFORMIN (GLUCOPHAGE) 1000 MG tablet TAKE 1 TABLET (1,000 MG TOTAL) BY MOUTH  2 (TWO) TIMES DAILY WITH A MEAL.  . [DISCONTINUED] lisinopril (ZESTRIL) 10 MG tablet TAKE 1 TABLET BY MOUTH EVERY DAY   No facility-administered medications prior to visit.    Review of Systems     Objective    BP 130/85 (BP Location: Right Arm, Patient Position: Sitting, Cuff Size: Large)   Pulse 78   Temp 97.9 F (36.6 C) (Oral)   Wt 181 lb 6.4 oz (82.3 kg)   SpO2 99%   BMI 25.30 kg/m     Physical Exam Constitutional:      Appearance: Normal appearance.  Cardiovascular:     Rate and Rhythm: Normal rate and regular rhythm.     Heart sounds: Normal heart sounds.  Pulmonary:     Effort: Pulmonary effort is normal.     Breath sounds: Normal breath sounds.  Skin:    General: Skin is warm and dry.  Neurological:     Mental Status: He is alert and oriented to person, place, and time. Mental status is at baseline.  Psychiatric:        Mood and Affect: Mood normal.        Behavior: Behavior normal.       Results for orders placed or performed in visit on 03/31/20  POCT glycosylated hemoglobin (Hb A1C)  Result Value Ref Range   Hemoglobin A1C 6.7 (A) 4.0 - 5.6 %   HbA1c POC (<> result, manual entry)     HbA1c, POC (prediabetic range)     HbA1c, POC (controlled diabetic range)      Assessment & Plan  1. Type 2 diabetes mellitus without complication, without long-term current use of insulin (HCC)  - POCT glycosylated hemoglobin (Hb A1C)  2. Hypertension associated with diabetes (HCC)  Continue Lisinopril.  3. Hyperlipidemia associated with type 2 diabetes mellitus (HCC)  LDL very low, defer statin.   4. Need for diphtheria-tetanus-pertussis (Tdap) vaccine  - Tdap vaccine greater than or equal to 7yo IM   Return in about 4 months (around 07/29/2020) for chronic .      ITrey Sailors, PA-C, have reviewed all documentation for this visit. The documentation on 04/05/20 for the exam, diagnosis, procedures, and orders are all accurate and complete.  The  entirety of the information documented in the History of Present Illness, Review of Systems and Physical Exam were personally obtained by me. Portions of this information were initially documented by [facility] and reviewed by me for thoroughness and accuracy.    Maryella Shivers  [facility] [phone] (phone) [phone] (fax)  [facility]

## 2020-04-04 ENCOUNTER — Other Ambulatory Visit: Payer: Self-pay | Admitting: Physician Assistant

## 2020-04-04 DIAGNOSIS — I10 Essential (primary) hypertension: Secondary | ICD-10-CM

## 2020-05-17 ENCOUNTER — Ambulatory Visit: Payer: Self-pay

## 2020-05-17 NOTE — Telephone Encounter (Signed)
Pt. States he noticed x 1 week. Reports mainly at his ankle. Goes down at night while in bed. Some redness noted. No pain. Offered to make appointment, declined. States "I'll come to the office one afternoon and get set up with a new doctor."  Answer Assessment - Initial Assessment Questions 1. ONSET: "When did the swelling start?" (e.g., minutes, hours, days)     1 week ago 2. LOCATION: "What part of the leg is swollen?"  "Are both legs swollen or just one leg?"     Right leg - ankle 3. SEVERITY: "How bad is the swelling?" (e.g., localized; mild, moderate, severe)  - Localized - small area of swelling localized to one leg  - MILD pedal edema - swelling limited to foot and ankle, pitting edema < 1/4 inch (6 mm) deep, rest and elevation eliminate most or all swelling  - MODERATE edema - swelling of lower leg to knee, pitting edema > 1/4 inch (6 mm) deep, rest and elevation only partially reduce swelling  - SEVERE edema - swelling extends above knee, facial or hand swelling present      Mild 4. REDNESS: "Does the swelling look red or infected?"     Yes 5. PAIN: "Is the swelling painful to touch?" If Yes, ask: "How painful is it?"   (Scale 1-10; mild, moderate or severe)     No pain 6. FEVER: "Do you have a fever?" If Yes, ask: "What is it, how was it measured, and when did it start?"      No 7. CAUSE: "What do you think is causing the leg swelling?"     Unsure 8. MEDICAL HISTORY: "Do you have a history of heart failure, kidney disease, liver failure, or cancer?"     No 9. RECURRENT SYMPTOM: "Have you had leg swelling before?" If Yes, ask: "When was the last time?" "What happened that time?"     No 10. OTHER SYMPTOMS: "Do you have any other symptoms?" (e.g., chest pain, difficulty breathing)       No 11. PREGNANCY: "Is there any chance you are pregnant?" "When was your last menstrual period?"       No  Protocols used: LEG SWELLING AND EDEMA-A-AH

## 2020-05-18 ENCOUNTER — Other Ambulatory Visit: Payer: Self-pay | Admitting: Physician Assistant

## 2020-05-18 DIAGNOSIS — E119 Type 2 diabetes mellitus without complications: Secondary | ICD-10-CM

## 2020-05-21 ENCOUNTER — Encounter: Payer: Self-pay | Admitting: Family Medicine

## 2020-05-21 ENCOUNTER — Other Ambulatory Visit: Payer: Self-pay

## 2020-05-21 ENCOUNTER — Ambulatory Visit: Payer: Self-pay | Admitting: *Deleted

## 2020-05-21 ENCOUNTER — Ambulatory Visit: Payer: BC Managed Care – PPO | Admitting: Family Medicine

## 2020-05-21 ENCOUNTER — Ambulatory Visit
Admission: RE | Admit: 2020-05-21 | Discharge: 2020-05-21 | Disposition: A | Discharge status: Home / Self Care | Payer: BC Managed Care – PPO | Source: Ambulatory Visit | Attending: Family Medicine | Admitting: Family Medicine

## 2020-05-21 VITALS — BP 130/80 | HR 75 | Temp 97.9°F | Resp 16 | Ht 71.0 in | Wt 185.5 lb

## 2020-05-21 DIAGNOSIS — M7989 Other specified soft tissue disorders: Secondary | ICD-10-CM

## 2020-05-21 MED ORDER — NAPROXEN 500 MG PO TABS: 500.0000 mg | ORAL_TABLET | Freq: Two times a day (BID) | ORAL | 0 refills | Status: DC

## 2020-05-21 NOTE — Telephone Encounter (Signed)
Sonographer , Tobi Bastos , from  [facility] [place] [facility]. Called to report results of right leg doppler prior to patient leaving facility. Results from right leg doppler are negative for DVT but possible complex Baker's cyst in right leg. Patient is now leaving facility. Please advise if further orders needed for patient . Clinic closed for lunch , unable to contact FC at this time.

## 2020-05-21 NOTE — Addendum Note (Signed)
Addended by: Hyacinth Meeker on: 05/21/2020 04:31 PM   Modules accepted: Orders

## 2020-05-21 NOTE — Telephone Encounter (Signed)
Patient advised as below. Patient verbalizes understanding and is in agreement with treatment plan.  

## 2020-05-21 NOTE — Patient Instructions (Signed)
Deep Vein Thrombosis  Deep vein thrombosis (DVT) is a condition in which a blood clot forms in a deep vein, such as a vein in the lower leg, thigh, pelvis, or arm. Deep veins are veins in the deep venous system. A clot is blood that has thickened into a gel or solid. This condition is serious and can be life-threatening if the clot travels to the lungs and causes a blockage (pulmonary embolism) in the arteries of the lung. A DVT can also damage veins in the leg. This can lead to long-term, or chronic, venous disease, leg pain, swelling, discoloration, and ulcers or sores (post-thrombotic syndrome). What are the causes? This condition may be caused by:  A slowdown of blood flow.  Damage to a vein.  A condition that causes blood to clot more easily, such as certain blood-clotting disorders. What increases the risk? The following factors may make you more likely to develop this condition:  Having obesity.  Being older, especially older than age 60.  Being inactive (sedentary lifestyle) or not moving around. This may include: ? Sitting or lying down for longer than 4-6 hours other than to sleep at night. ? Being in the hospital, having major or lengthy surgery, or having a thin, flexible tube (central line catheter) placed in a large vein.  Being pregnant, giving birth, or having recently given birth.  Taking medicines that contain estrogen, such as birth control or hormone replacement therapy.  Using products that contain nicotine or tobacco, especially if you use hormonal birth control.  Having a history of blood clots or a blood-clotting disease, a blood vessel disease (peripheral vascular disease), or congestive heart disease.  Having a history of cancer, especially if being treated with chemotherapy. What are the signs or symptoms? Symptoms of this condition include:  Swelling, pain, pressure, or tenderness in an arm or a leg.  An arm or a leg becoming warm, red, or  discolored.  A leg turning very pale. You may have a large DVT. This is rare. If the clot is in your leg, you may notice symptoms more or have worse symptoms when you stand or walk. In some cases, there are no symptoms. How is this diagnosed? This condition is diagnosed with:  Your medical history and a physical exam.  Tests, such as: ? Blood tests to check how well your blood clots. ? Doppler ultrasound. This is the best way to find a DVT. ? Venogram. Contrast dye is injected into a vein, and X-rays are taken to check for clots. How is this treated? Treatment for this condition depends on:  The cause of your DVT.  The size and location of your DVT, or having more than one DVT.  Your risk for bleeding or developing more clots.  Other medical conditions you may have. Treatment may include:  Taking a blood thinner, also called an anticoagulant, to prevent clots from forming and growing.  Wearing compression stockings, if directed.  Injecting medicines into the affected vein to break up the clot (catheter-directed thrombolysis). This is used only for severe DVT and only if a specialist recommends it.  Specific surgical procedures, when DVT is severe or hard to treat. These may be done to: ? Isolate and remove your clot. ? Place an inferior vena cava (IVC) filter in a large vein to catch blood clots before they reach your lungs. You may get some medical treatments for 6 months or longer. Follow these instructions at home: If you are taking blood thinners:    Talk with your health care provider before you take any medicines that contain aspirin or NSAIDs, such as ibuprofen. These medicines increase your risk for dangerous bleeding.  Take your medicine exactly as told, at the same time every day. Do not skip a dose. Do not take more than the prescribed dose. This is important.  Ask your health care provider about foods and medicines that could change the way your blood thinner  works (may interact). Avoid these foods and medicines if you are told to do so.  Avoid anything that may cause bleeding or bruising. You may bleed more easily while taking blood thinners. ? Be very careful when using knives, scissors, or other sharp objects. ? Use an electric razor instead of a blade. ? Avoid activities that could cause injury or bruising, and follow instructions for preventing falls. ? Tell your health care provider if you have had any internal bleeding, bleeding ulcers, or neurologic diseases, such as strokes or cerebral aneurysms.  Wear a medical alert bracelet or carry a card that lists what medicines you take. General instructions  Take over-the-counter and prescription medicines only as told by your health care provider.  Return to your normal activities as told by your health care provider. Ask your health care provider what activities are safe for you.  If recommended, wear compression stockings as told by your health care provider. These stockings help to prevent blood clots and reduce swelling in your legs.  Keep all follow-up visits as told by your health care provider. This is important. Contact a health care provider if:  You miss a dose of your blood thinner.  You have new or worse pain, swelling, or redness in an arm or a leg.  You have worsening numbness or tingling in an arm or a leg.  You have unusual bruising. Get help right away if:  You have signs or symptoms that a blood clot has moved to the lungs. These may include: ? Shortness of breath. ? Chest pain. ? Fast or irregular heartbeats (palpitations). ? Light-headedness or dizziness. ? Coughing up blood.  You have signs or symptoms that your blood is too thin. These may include: ? Blood in your vomit, stool, or urine. ? A cut that will not stop bleeding. ? A menstrual period that is heavier than usual. ? A severe headache or confusion. These symptoms may represent a serious problem that  is an emergency. Do not wait to see if the symptoms will go away. Get medical help right away. Call your local emergency services (911 in the [place] Do not drive yourself to the hospital. Summary  Deep vein thrombosis (DVT) happens when a blood clot forms in a deep vein. This may occur in the lower leg, thigh, pelvis, or arm.  Symptoms affect the arm or leg and can include swelling, pain, tenderness, warmth, redness, or discoloration.  This condition may be treated with medicines or compression stockings. In severe cases, surgery may be done.  If you are taking blood thinners, take them exactly as told. Do not skip a dose. Do not take more than is prescribed.  Get help right away if you have shortness of breath, chest pain, fast or irregular heartbeats, or blood in your vomit, urine, or stool. This information is not intended to replace advice given to you by your health care provider. Make sure you discuss any questions you have with your health care provider. Document Revised: 02/01/2019 Document Reviewed: 02/01/2019 Elsevier Patient Education  2021 Elsevier   Inc.  

## 2020-05-21 NOTE — Progress Notes (Signed)
Established patient visit   Patient: Alejandro Marquez   DOB: [birth year]   65 y.o. Male  MRN: [id number] Visit Date: 05/21/2020  Today's healthcare provider: Shirlee Latch, MD   Chief Complaint  Patient presents with  . Edema   Subjective    Edema Patient complains of edema in the right lower leg. The edema has been moderate. Onset of symptoms was 1 week ago, and patient reports symptoms have unchanged since that time. The edema is present all day. The patient states the problem is new. The swelling has been aggravated by long standing work hours. The swelling has been relieved by rest. Associated factors include: none. Cardiac risk factors include hypertension and male gender.     Patient Active Problem List   Diagnosis Date Noted  . Hypertension associated with diabetes (HCC) 03/31/2020  . Hyperlipidemia associated with type 2 diabetes mellitus (HCC) 03/31/2020  . Rosacea 03/21/2018  . Elevated transaminase level 06/19/2016  . Diabetes mellitus, type 2 (HCC) 07/07/2014  . 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 Smoking status: Never Smoker  . Smokeless tobacco: Never Used  Vaping Use  . Vaping Use: Never used  Substance Use Topics  . Alcohol use: Never  . Drug use: Never   No Known Allergies     Medications: Outpatient Medications Prior to Visit  Medication Sig  . glipiZIDE (GLUCOTROL) 10 MG tablet TAKE 1 TABLET (10 MG TOTAL) BY MOUTH 2 (TWO) TIMES DAILY BEFORE A MEAL.  Marland Kitchen lisinopril (ZESTRIL) 10 MG tablet TAKE 1 TABLET BY MOUTH EVERY DAY  . metFORMIN (GLUCOPHAGE) 1000 MG tablet TAKE 1 TABLET (1,000 MG TOTAL) BY MOUTH 2 (TWO) TIMES DAILY WITH A MEAL.   No facility-administered medications prior to visit.    Review of Systems  Constitutional: Positive for activity change. Negative for chills and fatigue.  Respiratory: Negative for chest tightness and shortness of breath.   Cardiovascular: Positive for  leg swelling. Negative for chest pain and palpitations.  Skin: Positive for color change.    Last CBC Lab Results  Component Value Date   WBC 14.3 (H) 06/13/2017   HGB 18.3 (H) 06/13/2017   HCT 53.0 (H) 06/13/2017   MCV 92.9 06/13/2017   MCH 32.1 06/13/2017   RDW 14.2 06/13/2017   PLT 228 06/13/2017   Last metabolic panel Lab Results  Component Value Date   GLUCOSE 147 (H) 09/25/2019   NA 145 (H) 09/25/2019   K 4.3 09/25/2019   CL 106 09/25/2019   CO2 26 09/25/2019   BUN 15 09/25/2019   CREATININE 1.01 09/25/2019   GFRNONAA 79 09/25/2019   GFRAA 92 09/25/2019   CALCIUM 9.8 09/25/2019   PROT 7.2 09/25/2019   ALBUMIN 4.6 09/25/2019   LABGLOB 2.6 09/25/2019   AGRATIO 1.8 09/25/2019   BILITOT 0.7 09/25/2019   ALKPHOS 74 09/25/2019   AST 34 09/25/2019   ALT 40 09/25/2019   ANIONGAP 11 06/13/2017        Objective    BP 130/80 (BP Location: Left Arm, Patient Position: Sitting, Cuff Size: Large)   Pulse 75   Temp 97.9 F (36.6 C) (Oral)   Resp 16   Ht 5\' 11"  (1.803 m)   Wt 185 lb 8 oz (84.1 kg)   SpO2 98%   BMI 25.87 kg/m  BP Readings from Last 3 Encounters:  05/21/20 130/80  03/31/20 130/85  09/25/19 116/62   Wt Readings from Last  3 Encounters:  05/21/20 185 lb 8 oz (84.1 kg)  03/31/20 181 lb 6.4 oz (82.3 kg)  09/25/19 179 lb 12.8 oz (81.6 kg)      Physical Exam Vitals reviewed.  Constitutional:      General: He is not in acute distress.    Appearance: Normal appearance. He is not diaphoretic.  HENT:     Head: Normocephalic and atraumatic.  Eyes:     General: No scleral icterus.    Conjunctiva/sclera: Conjunctivae normal.  Cardiovascular:     Rate and Rhythm: Normal rate and regular rhythm.     Pulses: Normal pulses.     Heart sounds: Normal heart sounds. No murmur heard.   Pulmonary:     Effort: Pulmonary effort is normal. No respiratory distress.     Breath sounds: Normal breath sounds. No wheezing or rhonchi.  Abdominal:     General:  There is no distension.     Palpations: Abdomen is soft.     Tenderness: There is no abdominal tenderness.  Musculoskeletal:     Cervical back: Neck supple.     Right lower leg: Edema (2+) present.     Left lower leg: No edema.     Comments: Russella Dar difference in calf circumference R>L  Lymphadenopathy:     Cervical: No cervical adenopathy.  Skin:    General: Skin is warm and dry.     Capillary Refill: Capillary refill takes less than 2 seconds.     Findings: No rash.  Neurological:     Mental Status: He is alert and oriented to person, place, and time.     Cranial Nerves: No cranial nerve deficit.  Psychiatric:        Mood and Affect: Mood normal.        Behavior: Behavior normal.       No results found for any visits on 05/21/20.  Assessment & Plan     1. Swelling of right lower extremity - new unilateral LE edema x7d - no trauma/imobility/surgery - no inciting event - concern for unprovoked DVT - will get STAT Venous US today and start anticoag if positive - will need Hematology referral and workup for unprovoked DVT if positive - US Venous Img Lower Unilateral Right (DVT)   Return if symptoms worsen or fail to improve.      I, Shirlee Latch, MD, have reviewed all documentation for this visit. The documentation on 05/21/20 for the exam, diagnosis, procedures, and orders are all accurate and complete.   Daris Aristizabal, Marzella Schlein, MD, MPH [facility]

## 2020-05-21 NOTE — Telephone Encounter (Signed)
Please advise patient there is no blood clot, but there is cyst in the back of his leg called a Baker's cyst. This is usually due to inflammation from arthritis in the joint. Usually gets better with anti-inflammatory medications.  Recommend he start naprosyn 500mg  one tablet twice a day for 10 days.

## 2020-05-26 ENCOUNTER — Ambulatory Visit: Payer: BC Managed Care – PPO | Admitting: Adult Health

## 2020-07-29 ENCOUNTER — Encounter: Payer: Self-pay | Admitting: Family Medicine

## 2020-07-29 ENCOUNTER — Ambulatory Visit (INDEPENDENT_AMBULATORY_CARE_PROVIDER_SITE_OTHER): Payer: BC Managed Care – PPO | Admitting: Family Medicine

## 2020-07-29 ENCOUNTER — Encounter: Payer: Self-pay | Admitting: Physician Assistant

## 2020-07-29 ENCOUNTER — Other Ambulatory Visit: Payer: Self-pay

## 2020-07-29 VITALS — BP 116/73 | HR 78 | Temp 97.9°F | Resp 16 | Ht 71.0 in | Wt 184.0 lb

## 2020-07-29 DIAGNOSIS — Z0000 Encounter for general adult medical examination without abnormal findings: Secondary | ICD-10-CM

## 2020-07-29 DIAGNOSIS — Z1211 Encounter for screening for malignant neoplasm of colon: Secondary | ICD-10-CM

## 2020-07-29 DIAGNOSIS — Z23 Encounter for immunization: Secondary | ICD-10-CM | POA: Diagnosis not present

## 2020-07-29 DIAGNOSIS — E1159 Type 2 diabetes mellitus with other circulatory complications: Secondary | ICD-10-CM | POA: Diagnosis not present

## 2020-07-29 DIAGNOSIS — Z1159 Encounter for screening for other viral diseases: Secondary | ICD-10-CM

## 2020-07-29 DIAGNOSIS — E785 Hyperlipidemia, unspecified: Secondary | ICD-10-CM

## 2020-07-29 DIAGNOSIS — Z114 Encounter for screening for human immunodeficiency virus [HIV]: Secondary | ICD-10-CM

## 2020-07-29 DIAGNOSIS — E1169 Type 2 diabetes mellitus with other specified complication: Secondary | ICD-10-CM

## 2020-07-29 DIAGNOSIS — I152 Hypertension secondary to endocrine disorders: Secondary | ICD-10-CM

## 2020-07-29 DIAGNOSIS — E119 Type 2 diabetes mellitus without complications: Secondary | ICD-10-CM

## 2020-07-29 NOTE — Patient Instructions (Signed)

## 2020-07-29 NOTE — Progress Notes (Signed)
Complete physical exam   Patient: Alejandro Marquez   DOB: 07/13/1955   65 y.o. Male  MRN: [id number] Visit Date: 07/29/2020  Today's healthcare provider: Dortha Kern, PA-C   Chief Complaint  Patient presents with   Annual Exam   Subjective    Alejandro Marquez is a 65 y.o. male who presents today for a complete physical exam.  He reports consuming a general diet. The patient has a physically strenuous job, but has no regular exercise apart from work.  He generally feels well. He reports sleeping well. He does not have additional problems to discuss today.     Past Medical History:  Diagnosis Date   Diabetes mellitus without complication (HCC)    Syncope    Past Surgical History:  Procedure Laterality Date   HERNIA REPAIR  1968   HERNIA REPAIR      Social History   Socioeconomic History   Marital status: Single    Spouse name: Not on file   Number of children: Not on file   Years of education: Not on file   Highest education level: Not on file  Occupational History   Not on file  Tobacco Use   Smoking status: Never   Smokeless tobacco: Never  Vaping Use   Vaping Use: Never used  Substance and Sexual Activity   Alcohol use: Never   Drug use: Never   Sexual activity: Not on file  Other Topics Concern   Not on file  Social History Narrative   ** Merged History Encounter **       Social Determinants of Health   Financial Resource Strain: Not on file  Food Insecurity: Not on file  Transportation Needs: Not on file  Physical Activity: Not on file  Stress: Not on file  Social Connections: Not on file  Intimate Partner Violence: Not on file   Family Status  Relation Name Status   Mother  Alive   Father  Deceased   Sister  Alive   Brother  Alive   Daughter  Alive   MGM  Deceased   Daughter  Alive   Family History  Problem Relation Age of Onset   Parkinson's disease Father    Diabetes Maternal Grandmother    No Known Allergies   Patient Care Team: Maryella Shivers as PCP - General (Physician Assistant) Anola Gurney, PA (Family Medicine)   Medications: Outpatient Medications Prior to Visit  Medication Sig   glipiZIDE (GLUCOTROL) 10 MG tablet TAKE 1 TABLET (10 MG TOTAL) BY MOUTH 2 (TWO) TIMES DAILY BEFORE A MEAL.   lisinopril (ZESTRIL) 10 MG tablet TAKE 1 TABLET BY MOUTH EVERY DAY   metFORMIN (GLUCOPHAGE) 1000 MG tablet TAKE 1 TABLET (1,000 MG TOTAL) BY MOUTH 2 (TWO) TIMES DAILY WITH A MEAL.   [DISCONTINUED] naproxen (NAPROSYN) 500 MG tablet Take 1 tablet (500 mg total) by mouth 2 (two) times daily with a meal. (Patient not taking: Reported on 07/29/2020)   No facility-administered medications prior to visit.    Review of Systems  All other systems reviewed and are negative.  Last CBC Lab Results  Component Value Date   WBC 14.3 (H) 06/13/2017   HGB 18.3 (H) 06/13/2017   HCT 53.0 (H) 06/13/2017   MCV 92.9 06/13/2017   MCH 32.1 06/13/2017   RDW 14.2 06/13/2017   PLT 228 06/13/2017   Last metabolic panel Lab Results  Component Value Date   GLUCOSE 147 (H) 09/25/2019   NA  145 (H) 09/25/2019   K 4.3 09/25/2019   CL 106 09/25/2019   CO2 26 09/25/2019   BUN 15 09/25/2019   CREATININE 1.01 09/25/2019   GFRNONAA 79 09/25/2019   GFRAA 92 09/25/2019   CALCIUM 9.8 09/25/2019   PROT 7.2 09/25/2019   ALBUMIN 4.6 09/25/2019   LABGLOB 2.6 09/25/2019   AGRATIO 1.8 09/25/2019   BILITOT 0.7 09/25/2019   ALKPHOS 74 09/25/2019   AST 34 09/25/2019   ALT 40 09/25/2019   ANIONGAP 11 06/13/2017   Last lipids Lab Results  Component Value Date   CHOL 69 (L) 06/25/2019   HDL 34 (L) 06/25/2019   LDLCALC 15 06/25/2019   TRIG 104 06/25/2019   CHOLHDL 2.0 06/25/2019   Last hemoglobin A1c Lab Results  Component Value Date   HGBA1C 6.7 (A) 03/31/2020      Objective    BP 116/73 (BP Location: Right Arm, Patient Position: Sitting, Cuff Size: Normal)   Pulse 78   Temp 97.9 F (36.6 C) (Temporal)    Resp 16   Ht 5\' 11"  (1.803 m)   Wt 184 lb (83.5 kg)   SpO2 98%   BMI 25.66 kg/m  BP Readings from Last 3 Encounters:  07/29/20 116/73  05/21/20 130/80  03/31/20 130/85   Wt Readings from Last 3 Encounters:  07/29/20 184 lb (83.5 kg)  05/21/20 185 lb 8 oz (84.1 kg)  03/31/20 181 lb 6.4 oz (82.3 kg)    Physical Exam Constitutional:      Appearance: He is well-developed.  HENT:     Head: Normocephalic and atraumatic.     Right Ear: External ear normal.     Left Ear: External ear normal.     Nose: Nose normal.  Eyes:     General:        Right eye: No discharge.     Conjunctiva/sclera: Conjunctivae normal.     Pupils: Pupils are equal, round, and reactive to light.  Neck:     Thyroid: No thyromegaly.     Trachea: No tracheal deviation.  Cardiovascular:     Rate and Rhythm: Normal rate and regular rhythm.     Heart sounds: Normal heart sounds. No murmur heard. Pulmonary:     Effort: Pulmonary effort is normal. No respiratory distress.     Breath sounds: Normal breath sounds. No wheezing or rales.  Chest:     Chest wall: No tenderness.  Abdominal:     General: There is no distension.     Palpations: Abdomen is soft. There is no mass.     Tenderness: There is no abdominal tenderness. There is no guarding or rebound.  Musculoskeletal:        General: No tenderness. Normal range of motion.     Cervical back: Normal range of motion and neck supple.  Lymphadenopathy:     Cervical: No cervical adenopathy.  Skin:    General: Skin is warm and dry.     Findings: No erythema or rash.  Neurological:     Mental Status: He is alert and oriented to person, place, and time.     Cranial Nerves: No cranial nerve deficit.     Motor: No abnormal muscle tone.     Coordination: Coordination normal.     Deep Tendon Reflexes: Reflexes are normal and symmetric. Reflexes normal.  Psychiatric:        Behavior: Behavior normal.        Thought Content: Thought content normal.  Judgment: Judgment normal.      Last depression screening scores PHQ 2/9 Scores 07/29/2020 05/21/2020 10/21/2019  PHQ - 2 Score 0 0 0  PHQ- 9 Score 0 0 -   Last fall risk screening Fall Risk  07/29/2020  Falls in the past year? 0  Number falls in past yr: 0  Injury with Fall? 0  Risk for fall due to : No Fall Risks  Follow up Falls evaluation completed   Last Audit-C alcohol use screening Alcohol Use Disorder Test (AUDIT) 07/29/2020  1. How often do you have a drink containing alcohol? 0  2. How many drinks containing alcohol do you have on a typical day when you are drinking? 0  3. How often do you have six or more drinks on one occasion? 0  AUDIT-C Score 0   A score of 3 or more in women, and 4 or more in men indicates increased risk for alcohol abuse, EXCEPT if all of the points are from question 1   No results found for any visits on 07/29/20.  Assessment & Plan    Routine Health Maintenance and Physical Exam  Exercise Activities and Dietary recommendations  Goals   Continue physical job for exercise and balance regular diet.     Immunization History  Administered Date(s) Administered   Moderna Sars-Covid-2 Vaccination 09/26/2019, 10/24/2019   Pneumococcal Polysaccharide-23 03/28/2019   Tdap 03/31/2020    Health Maintenance  Topic Date Due   Fecal DNA (Cologuard)  Never done   Zoster Vaccines- Shingrix (1 of 2) Never done   COVID-19 Vaccine (3 - Booster for Moderna series) 03/25/2020   INFLUENZA VACCINE  09/20/2020   FOOT EXAM  09/24/2020   HEMOGLOBIN A1C  09/28/2020   OPHTHALMOLOGY EXAM  12/03/2020   TETANUS/TDAP  03/31/2030   PNEUMOCOCCAL POLYSACCHARIDE VACCINE AGE 56-64 HIGH RISK  Completed   Hepatitis C Screening  Completed   HIV Screening  Completed   Pneumococcal Vaccine 77-43 Years old  Aged Out   HPV VACCINES  Aged Out    Discussed health benefits of physical activity, and encouraged him to engage in regular exercise appropriate for his age and  condition.  1. Encounter for annual health examination Good general health. Maintain BMI below 25 and exercise regularly. - Comprehensive metabolic panel - Hemoglobin A1c - Lipid Panel With LDL/HDL Ratio  2. Colon cancer screening - Cologuard  3. Type 2 diabetes mellitus without complication, without long-term current use of insulin (HCC) Tolerating Glipizide 10 mg BID and Metformin 1000 mg BID without hypoglycemia. Recheck Hgb A1C. - Hemoglobin A1c  4. Hypertension associated with diabetes (HCC) Well controlled BP on the Lisinopril 10 mg qd. Recheck labs. - Comprehensive metabolic panel  5. Hyperlipidemia associated with type 2 diabetes mellitus (HCC) Recheck lipids. Maintain BMI below 25. - Lipid Panel With LDL/HDL Ratio  6. Encounter for hepatitis C screening test for low risk patient - Hepatitis C antibody  7. Screening for HIV (human immunodeficiency virus) - HIV Antibody (routine testing w rflx)  8. Need for shingles vaccine - Varicella-zoster vaccine IM   Return in about 3 months (around 10/29/2020) for chronic disease f/u.     I, Malaak Stach, PA-C, have reviewed all documentation for this visit. The documentation on 07/29/20 for the exam, diagnosis, procedures, and orders are all accurate and complete.    Dortha Kern, PA-C  [facility] [phone] (phone) [phone] (fax)  [facility]

## 2020-08-16 ENCOUNTER — Telehealth: Payer: Self-pay | Admitting: Family Medicine

## 2020-08-16 NOTE — Telephone Encounter (Signed)
[facility] faxed refill request for the following medications:  1. glipiZIDE (GLUCOTROL) 10 MG tablet  2. metFORMIN (GLUCOPHAGE) 1000 MG tablet  Please advise. Thanks TNP

## 2020-08-17 ENCOUNTER — Other Ambulatory Visit: Payer: Self-pay

## 2020-08-17 DIAGNOSIS — E119 Type 2 diabetes mellitus without complications: Secondary | ICD-10-CM

## 2020-08-17 MED ORDER — GLIPIZIDE 10 MG PO TABS: 10.0000 mg | ORAL_TABLET | Freq: Two times a day (BID) | ORAL | 2 refills | Status: DC

## 2020-08-17 MED ORDER — METFORMIN HCL 1000 MG PO TABS: 1000.0000 mg | ORAL_TABLET | Freq: Two times a day (BID) | ORAL | 1 refills | Status: DC

## 2020-08-24 DIAGNOSIS — E1169 Type 2 diabetes mellitus with other specified complication: Secondary | ICD-10-CM | POA: Diagnosis not present

## 2020-08-24 DIAGNOSIS — I152 Hypertension secondary to endocrine disorders: Secondary | ICD-10-CM | POA: Diagnosis not present

## 2020-08-24 DIAGNOSIS — Z0000 Encounter for general adult medical examination without abnormal findings: Secondary | ICD-10-CM | POA: Diagnosis not present

## 2020-08-24 DIAGNOSIS — E1159 Type 2 diabetes mellitus with other circulatory complications: Secondary | ICD-10-CM | POA: Diagnosis not present

## 2020-08-24 DIAGNOSIS — Z114 Encounter for screening for human immunodeficiency virus [HIV]: Secondary | ICD-10-CM | POA: Diagnosis not present

## 2020-08-24 DIAGNOSIS — E119 Type 2 diabetes mellitus without complications: Secondary | ICD-10-CM | POA: Diagnosis not present

## 2020-08-24 DIAGNOSIS — Z1159 Encounter for screening for other viral diseases: Secondary | ICD-10-CM | POA: Diagnosis not present

## 2020-08-25 LAB — LIPID PANEL WITH LDL/HDL RATIO
Cholesterol, Total: 63 mg/dL — ABNORMAL LOW (ref 100–199)
HDL: 34 mg/dL — ABNORMAL LOW (ref >39)
LDL Chol Calc (NIH): 16 mg/dL (ref 0–99)
LDL/HDL Ratio: 0.5 ratio (ref 0.0–3.6)
Triglycerides: 49 mg/dL (ref 0–149)
VLDL Cholesterol Cal: 13 mg/dL (ref 5–40)

## 2020-08-25 LAB — COMPREHENSIVE METABOLIC PANEL
ALT: 40 IU/L (ref 0–44)
AST: 32 IU/L (ref 0–40)
Albumin/Globulin Ratio: 1.6 (ref 1.2–2.2)
Albumin: 4.2 g/dL (ref 3.8–4.8)
Alkaline Phosphatase: 75 IU/L (ref 44–121)
BUN/Creatinine Ratio: 14 (ref 10–24)
BUN: 14 mg/dL (ref 8–27)
Bilirubin Total: 0.5 mg/dL (ref 0.0–1.2)
CO2: 23 mmol/L (ref 20–29)
Calcium: 9.3 mg/dL (ref 8.6–10.2)
Chloride: 100 mmol/L (ref 96–106)
Creatinine, Ser: 1.03 mg/dL (ref 0.76–1.27)
Globulin, Total: 2.6 g/dL (ref 1.5–4.5)
Glucose: 226 mg/dL — ABNORMAL HIGH (ref 65–99)
Potassium: 4.5 mmol/L (ref 3.5–5.2)
Sodium: 137 mmol/L (ref 134–144)
Total Protein: 6.8 g/dL (ref 6.0–8.5)
eGFR: 82 mL/min/{1.73_m2} (ref >59)

## 2020-08-25 LAB — HEPATITIS C ANTIBODY: Hep C Virus Ab: <0.1 s/co ratio (ref 0.0–0.9)

## 2020-08-25 LAB — HIV ANTIBODY (ROUTINE TESTING W REFLEX): HIV Screen 4th Generation wRfx: NONREACTIVE

## 2020-08-25 LAB — HEMOGLOBIN A1C
Est. average glucose Bld gHb Est-mCnc: 166 mg/dL
Hgb A1c MFr Bld: 7.4 % — ABNORMAL HIGH (ref 4.8–5.6)

## 2020-10-01 ENCOUNTER — Telehealth: Payer: Self-pay

## 2020-10-01 NOTE — Telephone Encounter (Signed)
[facility] faxed refill request for the following medications:  lisinopril (ZESTRIL) 10 MG tablet   Please advise.  

## 2020-10-04 ENCOUNTER — Other Ambulatory Visit: Payer: Self-pay

## 2020-10-04 DIAGNOSIS — I10 Essential (primary) hypertension: Secondary | ICD-10-CM

## 2020-10-04 MED ORDER — LISINOPRIL 10 MG PO TABS: 10.0000 mg | ORAL_TABLET | Freq: Every day | ORAL | 1 refills | Status: DC

## 2020-10-22 ENCOUNTER — Ambulatory Visit: Payer: BC Managed Care – PPO | Admitting: Family Medicine

## 2020-10-22 ENCOUNTER — Other Ambulatory Visit: Payer: Self-pay

## 2020-10-22 ENCOUNTER — Encounter: Payer: Self-pay | Admitting: Family Medicine

## 2020-10-22 VITALS — BP 112/66 | HR 80 | Resp 16 | Ht 71.0 in | Wt 183.0 lb

## 2020-10-22 DIAGNOSIS — E785 Hyperlipidemia, unspecified: Secondary | ICD-10-CM | POA: Diagnosis not present

## 2020-10-22 DIAGNOSIS — I10 Essential (primary) hypertension: Secondary | ICD-10-CM | POA: Diagnosis not present

## 2020-10-22 DIAGNOSIS — E119 Type 2 diabetes mellitus without complications: Secondary | ICD-10-CM

## 2020-10-22 DIAGNOSIS — E1169 Type 2 diabetes mellitus with other specified complication: Secondary | ICD-10-CM | POA: Diagnosis not present

## 2020-10-22 NOTE — Progress Notes (Signed)
I,April Miller,acting as a Neurosurgeon for [employer], PA-C.,have documented all relevant documentation on the behalf of Dortha Kern, PA-C,as directed by  [employer], PA-C while in the presence of [employer], PA-C.   Established patient visit   Patient: Alejandro Marquez   DOB: [birth year]   65 y.o. Male  MRN: [id number] Visit Date: 10/22/2020  Today's healthcare provider: Dortha Kern, PA-C   Chief Complaint  Patient presents with   Follow-up   Hypertension   Subjective  -------------------------------------------------------------------------------------------------------------------- HPI  Hypertension, follow-up  BP Readings from Last 3 Encounters:  10/22/20 112/66  07/29/20 116/73  05/21/20 130/80   Wt Readings from Last 3 Encounters:  10/22/20 183 lb (83 kg)  07/29/20 184 lb (83.5 kg)  05/21/20 185 lb 8 oz (84.1 kg)     He was last seen for hypertension 3 months ago.  BP at that visit was 116/73. Management since that visit includes; Well controlled BP on the Lisinopril 10 mg qd. He reports good compliance with treatment. He is not having side effects. none He is exercising. He is adherent to low salt diet.   Outside blood pressures are not not checking  He does not smoke.  Use of agents associated with hypertension: none.   --------------------------------------------------------------------------------------------------- Past Medical History:  Diagnosis Date   Diabetes mellitus without complication (HCC)    Syncope    Past Surgical History:  Procedure Laterality Date   HERNIA REPAIR  1968   HERNIA REPAIR     Family History  Problem Relation Age of Onset   Parkinson's disease Father    Diabetes Maternal Grandmother    Social History   Tobacco Use   Smoking status: Never   Smokeless tobacco: Never  Vaping Use   Vaping Use: Never used  Substance Use Topics   Alcohol use: Never   Drug use: Never   No Known  Allergies  Medications: Outpatient Medications Prior to Visit  Medication Sig   glipiZIDE (GLUCOTROL) 10 MG tablet Take 1 tablet (10 mg total) by mouth 2 (two) times daily before a meal.   lisinopril (ZESTRIL) 10 MG tablet Take 1 tablet (10 mg total) by mouth daily.   metFORMIN (GLUCOPHAGE) 1000 MG tablet Take 1 tablet (1,000 mg total) by mouth 2 (two) times daily with a meal.   No facility-administered medications prior to visit.    Review of Systems  Constitutional:  Negative for appetite change, chills and fever.  Respiratory:  Negative for chest tightness, shortness of breath and wheezing.   Cardiovascular:  Negative for chest pain and palpitations.  Gastrointestinal:  Negative for abdominal pain, nausea and vomiting.      Objective  -------------------------------------------------------------------------------------------------------------------- BP 112/66 (BP Location: Right Arm, Patient Position: Sitting, Cuff Size: Large)   Pulse 80   Resp 16   Ht 5\' 11"  (1.803 m)   Wt 183 lb (83 kg)   SpO2 96%   BMI 25.52 kg/m  BP Readings from Last 3 Encounters:  10/22/20 112/66  07/29/20 116/73  05/21/20 130/80   Wt Readings from Last 3 Encounters:  10/22/20 183 lb (83 kg)  07/29/20 184 lb (83.5 kg)  05/21/20 185 lb 8 oz (84.1 kg)    Physical Exam Constitutional:      General: He is not in acute distress.    Appearance: He is well-developed.  HENT:     Head: Normocephalic and atraumatic.     Right Ear: Hearing normal.     Left Ear: Hearing normal.  Nose: Nose normal.     Mouth/Throat:     Pharynx: Oropharynx is clear.  Eyes:     General: Lids are normal. No scleral icterus.       Right eye: No discharge.        Left eye: No discharge.     Conjunctiva/sclera: Conjunctivae normal.  Cardiovascular:     Rate and Rhythm: Normal rate and regular rhythm.     Heart sounds: Normal heart sounds.  Pulmonary:     Effort: Pulmonary effort is normal. No respiratory  distress.     Breath sounds: Normal breath sounds.  Abdominal:     General: Bowel sounds are normal.     Palpations: Abdomen is soft.  Musculoskeletal:        General: Normal range of motion.  Skin:    Findings: No lesion or rash.  Neurological:     Mental Status: He is alert and oriented to person, place, and time.  Psychiatric:        Speech: Speech normal.        Behavior: Behavior normal.        Thought Content: Thought content normal.      No results found for any visits on 10/22/20.  Assessment & Plan  ---------------------------------------------------------------------------------------------------------------------- 1. Hypertension, unspecified type Well controlled BP on the Lisinopril 10 mg qd. Recheck labs and continue present regimen. - Comprehensive metabolic panel - CBC with Differential/Platelet  2. Type 2 diabetes mellitus without complication, without long-term current use of insulin (HCC) Did not get the Farxiga to help with glucose control. Hgb A1C was 7.4% and FBS 226 on 08-24-20. Still taking the Glipizide 10 mg 2 tablets daily and Metformin 1000 mg BID. No polyuria, peripheral neuropathy symptoms or polydipsia. Recheck labs. - Hemoglobin A1c - Comprehensive metabolic panel - CBC with Differential/Platelet  3. Hyperlipidemia associated with type 2 diabetes mellitus (HCC) Recheck levels with history of total cholesterol low on 08-24-20. - Comprehensive metabolic panel - CBC with Differential/Platelet   No follow-ups on file.      I, Xane Amsden, PA-C, have reviewed all documentation for this visit. The documentation on 10/22/20 for the exam, diagnosis, procedures, and orders are all accurate and complete.    Dortha Kern, PA-C  [facility] [phone] (phone) [phone] (fax)  [facility]

## 2020-11-08 DIAGNOSIS — H524 Presbyopia: Secondary | ICD-10-CM | POA: Diagnosis not present

## 2020-11-08 DIAGNOSIS — E119 Type 2 diabetes mellitus without complications: Secondary | ICD-10-CM | POA: Diagnosis not present

## 2020-11-08 LAB — HM DIABETES EYE EXAM

## 2020-12-10 DIAGNOSIS — E1169 Type 2 diabetes mellitus with other specified complication: Secondary | ICD-10-CM | POA: Diagnosis not present

## 2020-12-10 DIAGNOSIS — I10 Essential (primary) hypertension: Secondary | ICD-10-CM | POA: Diagnosis not present

## 2020-12-10 DIAGNOSIS — E119 Type 2 diabetes mellitus without complications: Secondary | ICD-10-CM | POA: Diagnosis not present

## 2020-12-10 DIAGNOSIS — E785 Hyperlipidemia, unspecified: Secondary | ICD-10-CM | POA: Diagnosis not present

## 2020-12-11 LAB — COMPREHENSIVE METABOLIC PANEL
ALT: 42 IU/L (ref 0–44)
AST: 33 IU/L (ref 0–40)
Albumin/Globulin Ratio: 1.8 (ref 1.2–2.2)
Albumin: 4.4 g/dL (ref 3.8–4.8)
Alkaline Phosphatase: 69 IU/L (ref 44–121)
BUN/Creatinine Ratio: 15 (ref 10–24)
BUN: 15 mg/dL (ref 8–27)
Bilirubin Total: 0.6 mg/dL (ref 0.0–1.2)
CO2: 26 mmol/L (ref 20–29)
Calcium: 9.7 mg/dL (ref 8.6–10.2)
Chloride: 104 mmol/L (ref 96–106)
Creatinine, Ser: 1.02 mg/dL (ref 0.76–1.27)
Globulin, Total: 2.5 g/dL (ref 1.5–4.5)
Glucose: 155 mg/dL — ABNORMAL HIGH (ref 70–99)
Potassium: 4.8 mmol/L (ref 3.5–5.2)
Sodium: 141 mmol/L (ref 134–144)
Total Protein: 6.9 g/dL (ref 6.0–8.5)
eGFR: 83 mL/min/{1.73_m2} (ref >59)

## 2020-12-11 LAB — CBC WITH DIFFERENTIAL/PLATELET
Basophils Absolute: 0 10*3/uL (ref 0.0–0.2)
Basos: 1 %
EOS (ABSOLUTE): 0.2 10*3/uL (ref 0.0–0.4)
Eos: 6 %
Hematocrit: 44.4 % (ref 37.5–51.0)
Hemoglobin: 15.2 g/dL (ref 13.0–17.7)
Immature Grans (Abs): 0 10*3/uL (ref 0.0–0.1)
Immature Granulocytes: 0 %
Lymphocytes Absolute: 1.1 10*3/uL (ref 0.7–3.1)
Lymphs: 27 %
MCH: 31.1 pg (ref 26.6–33.0)
MCHC: 34.2 g/dL (ref 31.5–35.7)
MCV: 91 fL (ref 79–97)
Monocytes Absolute: 0.4 10*3/uL (ref 0.1–0.9)
Monocytes: 9 %
Neutrophils Absolute: 2.4 10*3/uL (ref 1.4–7.0)
Neutrophils: 57 %
Platelets: 107 10*3/uL — ABNORMAL LOW (ref 150–450)
RBC: 4.88 x10E6/uL (ref 4.14–5.80)
RDW: 11.5 % — ABNORMAL LOW (ref 11.6–15.4)
WBC: 4.1 10*3/uL (ref 3.4–10.8)

## 2020-12-11 LAB — HEMOGLOBIN A1C
Est. average glucose Bld gHb Est-mCnc: 148 mg/dL
Hgb A1c MFr Bld: 6.8 % — ABNORMAL HIGH (ref 4.8–5.6)

## 2020-12-20 ENCOUNTER — Telehealth: Payer: Self-pay | Admitting: Physician Assistant

## 2020-12-20 DIAGNOSIS — E119 Type 2 diabetes mellitus without complications: Secondary | ICD-10-CM

## 2020-12-20 MED ORDER — GLIPIZIDE 10 MG PO TABS: 10.0000 mg | ORAL_TABLET | Freq: Two times a day (BID) | ORAL | 2 refills | Status: DC

## 2020-12-20 NOTE — Telephone Encounter (Signed)
[facility] faxed refill request for the following medications:  glipiZIDE (GLUCOTROL) 10 MG tablet   Please advise.  

## 2020-12-20 NOTE — Telephone Encounter (Signed)
Rx was sent to pharmacy. 

## 2021-01-04 ENCOUNTER — Telehealth: Payer: Self-pay | Admitting: Family Medicine

## 2021-01-04 NOTE — Telephone Encounter (Signed)
Tasha with [employer] called back. Stated she called on 12/27/20 and has not heard back. She has some clinical questions regarding pt's diabetes and medication and therapy recommendations. Please advise.  CB [phone]    Copied from CRM [id number]. Topic: General - Call Back - No Documentation >> Dec 24, 2020 11:46 AM Randol Kern wrote: Reason for CRM: Requesting to speak to the clinic, has questions about the patient's diabetes   Tasha calling from [facility] Best contact: [phone]  Rodney Booze with [employer] called back. Stated she called on 12/27/20 and has not heard back. She has some clinical questions regarding pt's diabetes and medication and therapy recommendations. Please advise.  CB [phone]

## 2021-01-04 NOTE — Telephone Encounter (Signed)
Rodney Booze states someone from our office already spoke to her about this pt.   Thanks,   -Vernona Rieger

## 2021-01-24 ENCOUNTER — Ambulatory Visit: Payer: BC Managed Care – PPO | Admitting: Family Medicine

## 2021-02-07 ENCOUNTER — Ambulatory Visit: Payer: BC Managed Care – PPO | Admitting: Family Medicine

## 2021-02-07 ENCOUNTER — Encounter: Payer: Self-pay | Admitting: Family Medicine

## 2021-02-07 ENCOUNTER — Other Ambulatory Visit: Payer: Self-pay

## 2021-02-07 VITALS — BP 119/71 | HR 83 | Temp 98.2°F | Wt 184.0 lb

## 2021-02-07 DIAGNOSIS — Z23 Encounter for immunization: Secondary | ICD-10-CM

## 2021-02-07 DIAGNOSIS — I152 Hypertension secondary to endocrine disorders: Secondary | ICD-10-CM | POA: Diagnosis not present

## 2021-02-07 DIAGNOSIS — D696 Thrombocytopenia, unspecified: Secondary | ICD-10-CM | POA: Diagnosis not present

## 2021-02-07 DIAGNOSIS — E1159 Type 2 diabetes mellitus with other circulatory complications: Secondary | ICD-10-CM | POA: Diagnosis not present

## 2021-02-07 NOTE — Progress Notes (Signed)
Established patient visit   Patient: Alejandro Marquez   DOB: May 06, 1955   65 y.o. Male  MRN: [id number] Visit Date: 02/07/2021  Today's healthcare provider: Mila Merry, MD   Chief Complaint  Patient presents with   Diabetes   Subjective    HPI  Diabetes Mellitus Type II, follow-up  Lab Results  Component Value Date   HGBA1C 6.8 (H) 12/10/2020   HGBA1C 7.4 (H) 08/24/2020   HGBA1C 6.7 (A) 03/31/2020   Last seen for diabetes 3 months ago.  Management since then includes; Did not get the Farxiga to help with glucose control. Hgb A1C was 7.4% and FBS 226 on 08-24-20. Still taking the Glipizide 10 mg 2 tablets daily and Metformin 1000 mg BID. No polyuria, peripheral neuropathy symptoms or polydipsia. Recheck labs. He reports excellent compliance with treatment. He is not having side effects.   Home blood sugar records:  are not being checked.  Episodes of hypoglycemia? Yes Occasonally   Current insulin regiment: None Most Recent Eye Exam: 09/2020  --------------------------------------------------------------------------------------------------- Hypertension, follow-up  BP Readings from Last 3 Encounters:  02/07/21 119/71  10/22/20 112/66  07/29/20 116/73   Wt Readings from Last 3 Encounters:  02/07/21 184 lb (83.5 kg)  10/22/20 183 lb (83 kg)  07/29/20 184 lb (83.5 kg)     He was last seen for hypertension 3 months ago.  BP at that visit was 112/66. Management since that visit includes; Well controlled BP on the Lisinopril 10 mg qd. Recheck labs and continue present regimen. He reports excellent compliance with treatment. He is not having side effects.  He is exercising. He is adherent to low salt diet.   Outside blood pressures are not being checked.  He does not smoke.  Use of agents associated with hypertension: none.   ---------------------------------------------------------------------------------------------------  He was noted to have  thrombocytopenia with platelet count of 107 on 12/10/2020, which is a significant drop from previous measurement. He denies any unusual bleeding or bruising.    Medications: Outpatient Medications Prior to Visit  Medication Sig   glipiZIDE (GLUCOTROL) 10 MG tablet Take 1 tablet (10 mg total) by mouth 2 (two) times daily before a meal.   lisinopril (ZESTRIL) 10 MG tablet Take 1 tablet (10 mg total) by mouth daily.   metFORMIN (GLUCOPHAGE) 1000 MG tablet Take 1 tablet (1,000 mg total) by mouth 2 (two) times daily with a meal.   No facility-administered medications prior to visit.    Review of Systems  Constitutional: Negative.  Negative for appetite change, chills and fever.  Respiratory: Negative.  Negative for chest tightness, shortness of breath and wheezing.   Cardiovascular: Negative.  Negative for chest pain and palpitations.  Gastrointestinal: Negative.  Negative for abdominal pain, nausea and vomiting.  Neurological:  Negative for dizziness, light-headedness and headaches.      Objective    BP 119/71 (BP Location: Right Arm, Patient Position: Sitting, Cuff Size: Large)    Pulse 83    Temp 98.2 F (36.8 C) (Oral)    Wt 184 lb (83.5 kg)    SpO2 100%    BMI 25.66 kg/m    Physical Exam   General: Appearance:     Well developed, well nourished male in no acute distress  Eyes:    PERRL, conjunctiva/corneas clear, EOM's intact       Lungs:     Clear to auscultation bilaterally, respirations unlabored  Heart:    Normal heart rate. Normal rhythm. No murmurs,  rubs, or gallops.    MS:   All extremities are intact.    Neurologic:   Awake, alert, oriented x 3. No apparent focal neurological defect.         Assessment & Plan     1. Thrombocytopenia (HCC)  - CBC  2. Primary hypertension.  Well controlled.  Continue current medications.    3. Need for influenza vaccination  - Flu Vaccine QUAD 36+ mos IM (Fluarix/Fluzone)  4. Need for shingles vaccine  - Administer Zoster,  Recombinant (Shingrix) Vaccine #2  He is doing well with current diabetic medication regiment, will due for follow up in 3-5 months.    Recommend colon cancer screening. He refused colonoscopy order and said he would not to a stool test even if I ordered another one.      The entirety of the information documented in the History of Present Illness, Review of Systems and Physical Exam were personally obtained by me. Portions of this information were initially documented by the CMA and reviewed by me for thoroughness and accuracy.     Mila Merry, MD  [facility] [phone] (phone) [phone] (fax)  [facility]

## 2021-02-09 DIAGNOSIS — D696 Thrombocytopenia, unspecified: Secondary | ICD-10-CM | POA: Diagnosis not present

## 2021-02-10 LAB — CBC
Hematocrit: 45 % (ref 37.5–51.0)
Hemoglobin: 15.2 g/dL (ref 13.0–17.7)
MCH: 30.8 pg (ref 26.6–33.0)
MCHC: 33.8 g/dL (ref 31.5–35.7)
MCV: 91 fL (ref 79–97)
Platelets: 104 10*3/uL — ABNORMAL LOW (ref 150–450)
RBC: 4.93 x10E6/uL (ref 4.14–5.80)
RDW: 12.6 % (ref 11.6–15.4)
WBC: 4.7 10*3/uL (ref 3.4–10.8)

## 2021-03-19 ENCOUNTER — Other Ambulatory Visit: Payer: Self-pay | Admitting: Family Medicine

## 2021-03-19 DIAGNOSIS — E119 Type 2 diabetes mellitus without complications: Secondary | ICD-10-CM

## 2021-03-19 NOTE — Telephone Encounter (Signed)
Requested Prescriptions  Pending Prescriptions Disp Refills   glipiZIDE (GLUCOTROL) 10 MG tablet [Pharmacy Med Name: GLIPIZIDE 10 MG TABLET] 60 tablet 2    Sig: TAKE 1 TABLET (10 MG TOTAL) BY MOUTH 2 (TWO) TIMES DAILY BEFORE A MEAL.     Endocrinology:  Diabetes - Sulfonylureas Passed - 03/19/2021 12:45 AM      Passed - HBA1C is between 0 and 7.9 and within 180 days    Hgb A1c MFr Bld  Date Value Ref Range Status  12/10/2020 6.8 (H) 4.8 - 5.6 % Final    Comment:             Prediabetes: 5.7 - 6.4          Diabetes: >6.4          Glycemic control for adults with diabetes: <7.0          Passed - Valid encounter within last 6 months    Recent Outpatient Visits          1 month ago Thrombocytopenia [facility])   [facility] Birdie Sons, MD   4 months ago Hypertension, unspecified type   [place], PA-C   7 months ago Encounter for annual health examination   [place], PA-C   10 months ago Swelling of right lower extremity   [facility] [place], Dionne Bucy, MD   11 months ago Type 2 diabetes mellitus without complication, without long-term current use of insulin [facility])   [place], Wendee Beavers, [place]      Future Appointments            In 1 month Fisher, Kirstie Peri, MD [facility], [place]

## 2021-03-21 ENCOUNTER — Telehealth: Payer: Self-pay | Admitting: Physician Assistant

## 2021-03-21 ENCOUNTER — Other Ambulatory Visit: Payer: Self-pay

## 2021-03-21 DIAGNOSIS — E119 Type 2 diabetes mellitus without complications: Secondary | ICD-10-CM

## 2021-03-21 MED ORDER — METFORMIN HCL 1000 MG PO TABS: 1000.0000 mg | ORAL_TABLET | Freq: Two times a day (BID) | ORAL | 0 refills | Status: DC

## 2021-03-21 NOTE — Telephone Encounter (Signed)
[facility] faxed refill request for the following medications:   metFORMIN (GLUCOPHAGE) 1000 MG tablet   Please advise.  

## 2021-04-08 ENCOUNTER — Other Ambulatory Visit: Payer: Self-pay

## 2021-04-08 ENCOUNTER — Telehealth: Payer: Self-pay | Admitting: Physician Assistant

## 2021-04-08 DIAGNOSIS — I10 Essential (primary) hypertension: Secondary | ICD-10-CM

## 2021-04-08 MED ORDER — LISINOPRIL 10 MG PO TABS: 10.0000 mg | ORAL_TABLET | Freq: Every day | ORAL | 1 refills | Status: DC

## 2021-04-08 NOTE — Telephone Encounter (Signed)
[facility] faxed refill request for the following medications: ? ?lisinopril (ZESTRIL) 10 MG tablet ? ? ?Please advise ? ?

## 2021-05-03 ENCOUNTER — Ambulatory Visit: Payer: BC Managed Care – PPO | Admitting: Family Medicine

## 2021-05-03 ENCOUNTER — Encounter: Payer: Self-pay | Admitting: Family Medicine

## 2021-05-03 ENCOUNTER — Other Ambulatory Visit: Payer: Self-pay

## 2021-05-03 VITALS — BP 120/74 | HR 83 | Temp 98.3°F | Resp 14 | Wt 183.0 lb

## 2021-05-03 DIAGNOSIS — E119 Type 2 diabetes mellitus without complications: Secondary | ICD-10-CM

## 2021-05-03 DIAGNOSIS — D696 Thrombocytopenia, unspecified: Secondary | ICD-10-CM | POA: Diagnosis not present

## 2021-05-03 LAB — POCT GLYCOSYLATED HEMOGLOBIN (HGB A1C)
Est. average glucose Bld gHb Est-mCnc: 200
Hemoglobin A1C: 8.6 % — AB (ref 4.0–5.6)

## 2021-05-03 NOTE — Patient Instructions (Signed)
.   Please review the attached list of medications and notify my office if there are any errors.   . Please bring all of your medications to every appointment so we can make sure that our medication list is the same as yours.   

## 2021-05-03 NOTE — Progress Notes (Signed)
?  ? ?I,Roshena L Chambers,acting as a scribe for Lelon Huh, MD.,have documented all relevant documentation on the behalf of Lelon Huh, MD,as directed by  Lelon Huh, MD while in the presence of Lelon Huh, MD.  ? ? ?  ?Established patient visit ? ? ?Patient: Alejandro Marquez   DOB: [birth year]   66 y.o. Male  MRN: [id number] ?Visit Date: 05/03/2021 ? ?Today's healthcare provider: Lelon Huh, MD  ? ?Chief Complaint  ?Patient presents with  ? Diabetes  ? Hypertension  ? ?Subjective  ?  ?HPI  ?Diabetes Mellitus Type II, Follow-up ? ?Lab Results  ?Component Value Date  ? HGBA1C 6.8 (H) 12/10/2020  ? HGBA1C 7.4 (H) 08/24/2020  ? HGBA1C 6.7 (A) 03/31/2020  ? ?Wt Readings from Last 3 Encounters:  ?05/03/21 183 lb (83 kg)  ?02/07/21 184 lb (83.5 kg)  ?10/22/20 183 lb (83 kg)  ? ?Last seen for diabetes 4 months ago.  ?Management since then includes continuing same medication. ?He reports good compliance with treatment. ?He is not having side effects.  ?Symptoms: ?No fatigue No foot ulcerations  ?No appetite changes No nausea  ?No paresthesia of the feet  No polydipsia  ?No polyuria No visual disturbances   ?No vomiting   ? ? ?Home blood sugar records:  blood sugars are not checked at home ? ?Episodes of hypoglycemia? No  ?  ?Current insulin regiment: none ?Most Recent Eye Exam: <1 year ago ?Current exercise: walking ?Current diet habits: in general, an "unhealthy" diet ? ?Pertinent Labs: ?Lab Results  ?Component Value Date  ? CHOL 63 (L) 08/24/2020  ? HDL 34 (L) 08/24/2020  ? [place] 16 08/24/2020  ? TRIG 49 08/24/2020  ? CHOLHDL 2.0 06/25/2019  ? Lab Results  ?Component Value Date  ? NA 141 12/10/2020  ? K 4.8 12/10/2020  ? CREATININE 1.02 12/10/2020  ? EGFR 83 12/10/2020  ? MICROALBUR 50 09/25/2018  ? LABMICR 10.7 10/03/2017  ?  ? ?---------------------------------------------------------------------------------------------------  ?Hypertension, follow-up ? ?BP Readings from Last 3 Encounters:  ?05/03/21  120/74  ?02/07/21 119/71  ?10/22/20 112/66  ? Wt Readings from Last 3 Encounters:  ?05/03/21 183 lb (83 kg)  ?02/07/21 184 lb (83.5 kg)  ?10/22/20 183 lb (83 kg)  ?  ? ?He was last seen for hypertension 2 months ago.  ?BP at that visit was 119/71. Management since that visit includes continue same medication. ? ?He reports good compliance with treatment. ?He is not having side effects.  ?He is following a Regular diet. ?He is exercising. ?He does not smoke. ? ?Use of agents associated with hypertension: none.  ? ?Outside blood pressures are not checked. ?Symptoms: ?No chest pain No chest pressure  ?No palpitations No syncope  ?No dyspnea No orthopnea  ?No paroxysmal nocturnal dyspnea No lower extremity edema  ? ?  ? ?---------------------------------------------------------------------------------------------------  ?Follow up thrombocytopenia ?Lab Results  ?Component Value Date  ? PLT 104 (L) 02/09/2021  ?Denies alcohol use since he was in his 107s.  ? ? ? ?Medications: ?Outpatient Medications Prior to Visit  ?Medication Sig  ? glipiZIDE (GLUCOTROL) 10 MG tablet TAKE 1 TABLET (10 MG TOTAL) BY MOUTH 2 (TWO) TIMES DAILY BEFORE A MEAL.  ? lisinopril (ZESTRIL) 10 MG tablet Take 1 tablet (10 mg total) by mouth daily.  ? metFORMIN (GLUCOPHAGE) 1000 MG tablet Take 1 tablet (1,000 mg total) by mouth 2 (two) times daily with a meal.  ? ?No facility-administered medications prior to visit.  ? ? ?Review of Systems  ?  Constitutional:  Negative for appetite change, chills and fever.  ?Respiratory:  Negative for chest tightness, shortness of breath and wheezing.   ?Cardiovascular:  Negative for chest pain and palpitations.  ?Gastrointestinal:  Negative for abdominal pain, nausea and vomiting.  ? ? ?  Objective  ?  ?BP 120/74 (BP Location: Left Arm, Patient Position: Sitting, Cuff Size: Normal)   Pulse 83   Temp 98.3 ?F (36.8 ?C) (Oral)   Resp 14   Wt 183 lb (83 kg)   SpO2 98% Comment: room air  BMI 25.52 kg/m?  ? ? ?Physical  Exam  ?General appearance:  ?Well developed, well nourished male, cooperative and in no acute distress ?Head: Normocephalic, without obvious abnormality, atraumatic ?Respiratory: Respirations even and unlabored, normal respiratory rate ?Extremities: All extremities are intact.  ?Skin: Skin color, texture, turgor normal. No rashes seen  ?Psych: Appropriate mood and affect. ?Neurologic: Mental status: Alert, oriented to person, place, and time, thought content appropriate.  ? ?Results for orders placed or performed in visit on 05/03/21  ?POCT HgB A1C  ?Result Value Ref Range  ? Hemoglobin A1C 8.6 (A) 4.0 - 5.6 %  ? Est. average glucose Bld gHb Est-mCnc 200   ? ? Assessment & Plan  ?  ? ?1. Type 2 diabetes mellitus without complication, without long-term current use of insulin ([place]) ?A1c is unexpectedly high today. He is going to work on diet and exercise and recheck in 3 months.  ?- Urine Albumin-Creatinine with uACR ? ?2. Thrombocytopenia ([place]) ?Recently noted on two consecutive labs. Is noted to have mildly elevated transaminases in the past, but denies any history of alcohol use in his 63s.  ?- CBC ?- PT and PTT  ? ?Follow up diabetes 3 months.   ?   ? ?The entirety of the information documented in the History of Present Illness, Review of Systems and Physical Exam were personally obtained by me. Portions of this information were initially documented by the CMA and reviewed by me for thoroughness and accuracy.   ? ? ?Lelon Huh, MD  ?[facility] ?[phone] (phone) ?[phone] (fax) ? ?[facility]  ?

## 2021-05-04 LAB — MICROALBUMIN / CREATININE URINE RATIO
Creatinine, Urine: 155.4 mg/dL
Microalb/Creat Ratio: 6 mg/g creat (ref 0–29)
Microalbumin, Urine: 9.7 ug/mL

## 2021-05-06 DIAGNOSIS — D696 Thrombocytopenia, unspecified: Secondary | ICD-10-CM | POA: Diagnosis not present

## 2021-05-07 LAB — CBC
Hematocrit: 38.8 % (ref 37.5–51.0)
Hemoglobin: 13.4 g/dL (ref 13.0–17.7)
MCH: 31.2 pg (ref 26.6–33.0)
MCHC: 34.5 g/dL (ref 31.5–35.7)
MCV: 90 fL (ref 79–97)
Platelets: 128 10*3/uL — ABNORMAL LOW (ref 150–450)
RBC: 4.3 x10E6/uL (ref 4.14–5.80)
RDW: 13.1 % (ref 11.6–15.4)
WBC: 3.8 10*3/uL (ref 3.4–10.8)

## 2021-05-07 LAB — PT AND PTT
INR: 1 (ref 0.9–1.2)
Prothrombin Time: 10.7 s (ref 9.1–12.0)
aPTT: 28 s (ref 24–33)

## 2021-06-12 ENCOUNTER — Other Ambulatory Visit: Payer: Self-pay | Admitting: Physician Assistant

## 2021-06-12 DIAGNOSIS — E119 Type 2 diabetes mellitus without complications: Secondary | ICD-10-CM

## 2021-07-11 ENCOUNTER — Other Ambulatory Visit: Payer: Self-pay | Admitting: Physician Assistant

## 2021-07-11 DIAGNOSIS — E119 Type 2 diabetes mellitus without complications: Secondary | ICD-10-CM

## 2021-08-16 ENCOUNTER — Ambulatory Visit: Payer: BC Managed Care – PPO | Admitting: Family Medicine

## 2021-08-25 NOTE — Progress Notes (Signed)
I,Roshena L Chambers,acting as a scribe for Lelon Huh, MD.,have documented all relevant documentation on the behalf of Lelon Huh, MD,as directed by  Lelon Huh, MD while in the presence of Lelon Huh, MD.   Established patient visit   Patient: Alejandro Marquez   DOB: 03-03-1955   66 y.o. Male  MRN: [id number] Visit Date: 08/26/2021  Today's healthcare provider: Lelon Huh, MD   Chief Complaint  Patient presents with   Diabetes   Hypertension   Subjective    HPI  Diabetes Mellitus Type II, Follow-up  Lab Results  Component Value Date   HGBA1C 7.9 (A) 08/26/2021   HGBA1C 8.6 (A) 05/03/2021   HGBA1C 6.8 (H) 12/10/2020   Wt Readings from Last 3 Encounters:  08/26/21 174 lb (78.9 kg)  05/03/21 183 lb (83 kg)  02/07/21 184 lb (83.5 kg)   Last seen for diabetes 3 months ago.  Management since then includes advising patient to work on diet and exercise and recheck in 3 months.  He reports good compliance with treatment. He is not having side effects.  Symptoms: No fatigue No foot ulcerations  No appetite changes No nausea  No paresthesia of the feet  Yes polydipsia  No polyuria No visual disturbances   No vomiting     Home blood sugar records:  blood sugars are not checked  Episodes of hypoglycemia? No    Current insulin regiment: none Most Recent Eye Exam: 11/08/2020 Current exercise: walking Current diet habits: eats fast food  or frozen foods  Pertinent Labs: Lab Results  Component Value Date   CHOL 63 (L) 08/24/2020   HDL 34 (L) 08/24/2020   LDLCALC 16 08/24/2020   TRIG 49 08/24/2020   CHOLHDL 2.0 06/25/2019   Lab Results  Component Value Date   NA 141 12/10/2020   K 4.8 12/10/2020   CREATININE 1.02 12/10/2020   EGFR 83 12/10/2020   MICROALBUR 50 09/25/2018   LABMICR 9.7 05/03/2021     ---------------------------------------------------------------------------------------------------   Hypertension, follow-up  BP Readings  from Last 3 Encounters:  08/26/21 104/65  05/03/21 120/74  02/07/21 119/71   Wt Readings from Last 3 Encounters:  08/26/21 174 lb (78.9 kg)  05/03/21 183 lb (83 kg)  02/07/21 184 lb (83.5 kg)     He was last seen for hypertension 7 months ago.  BP at that visit was 119/71. Management since that visit includes continue same medication.  He reports good compliance with treatment. He is not having side effects.  He is following a Regular diet. He is exercising. He does not smoke.  Use of agents associated with hypertension: none.   Outside blood pressures are not checked. Symptoms: No chest pain No chest pressure  No palpitations No syncope  No dyspnea No orthopnea  No paroxysmal nocturnal dyspnea No lower extremity edema   Pertinent labs Lab Results  Component Value Date   CHOL 63 (L) 08/24/2020   HDL 34 (L) 08/24/2020   LDLCALC 16 08/24/2020   TRIG 49 08/24/2020   CHOLHDL 2.0 06/25/2019   Lab Results  Component Value Date   NA 141 12/10/2020   K 4.8 12/10/2020   CREATININE 1.02 12/10/2020   EGFR 83 12/10/2020   GLUCOSE 155 (H) 12/10/2020     The ASCVD Risk score (Arnett DK, et al., 2019) failed to calculate for the following reasons:   The valid total cholesterol range is 130 to 320 mg/dL  ---------------------------------------------------------------------------------------------------   Medications: Outpatient Medications Prior  to Visit  Medication Sig   glipiZIDE (GLUCOTROL) 10 MG tablet TAKE 1 TABLET (10 MG TOTAL) BY MOUTH TWICE A DAY BEFORE A MEAL   lisinopril (ZESTRIL) 10 MG tablet Take 1 tablet (10 mg total) by mouth daily.   metFORMIN (GLUCOPHAGE) 1000 MG tablet TAKE 1 TABLET (1,000 MG TOTAL) BY MOUTH TWICE A DAY WITH FOOD   No facility-administered medications prior to visit.    Review of Systems  Constitutional:  Positive for fatigue. Negative for appetite change, chills and fever.  Respiratory:  Negative for chest tightness, shortness of breath  and wheezing.   Cardiovascular:  Negative for chest pain and palpitations.  Gastrointestinal:  Negative for abdominal pain, nausea and vomiting.  Endocrine: Positive for polydipsia.       Objective    BP 104/65 (BP Location: Right Arm, Patient Position: Sitting, Cuff Size: Large)   Pulse 86   Temp 98.5 F (36.9 C) (Oral)   Resp 16   Wt 174 lb (78.9 kg)   SpO2 99% Comment: room air  BMI 24.27 kg/m    Physical Exam   General: Appearance:    Well developed, well nourished male in no acute distress  Eyes:    PERRL, conjunctiva/corneas clear, EOM's intact       Lungs:     Clear to auscultation bilaterally, respirations unlabored  Heart:    Normal heart rate. Normal rhythm. No murmurs, rubs, or gallops.    MS:   All extremities are intact.    Neurologic:   Awake, alert, oriented x 3. No apparent focal neurological defect.         Results for orders placed or performed in visit on 08/26/21  POCT HgB A1C  Result Value Ref Range   Hemoglobin A1C 7.9 (A) 4.0 - 5.6 %   Est. average glucose Bld gHb Est-mCnc 180     Assessment & Plan     1. Type 2 diabetes mellitus without complication, without long-term current use of insulin ([place]) Doing better with dietary improvements. He does not want to change medication regiments at this time.   Also counseled on recommendations for colon cancer screening which I strongly encouraged by patient declined.   Future Appointments  Date Time Provider [place]  01/09/2022  4:00 PM Caryn Section Kirstie Peri, MD [facility]         The entirety of the information documented in the History of Present Illness, Review of Systems and Physical Exam were personally obtained by me. Portions of this information were initially documented by the CMA and reviewed by me for thoroughness and accuracy.     Lelon Huh, MD  [facility] [phone] (phone) [phone] (fax)  [place]

## 2021-08-26 ENCOUNTER — Encounter: Payer: Self-pay | Admitting: Family Medicine

## 2021-08-26 ENCOUNTER — Ambulatory Visit: Payer: BC Managed Care – PPO | Admitting: Family Medicine

## 2021-08-26 VITALS — BP 104/65 | HR 86 | Temp 98.5°F | Resp 16 | Wt 174.0 lb

## 2021-08-26 DIAGNOSIS — E119 Type 2 diabetes mellitus without complications: Secondary | ICD-10-CM

## 2021-08-26 LAB — POCT GLYCOSYLATED HEMOGLOBIN (HGB A1C)
Est. average glucose Bld gHb Est-mCnc: 180
Hemoglobin A1C: 7.9 % — AB (ref 4.0–5.6)

## 2021-08-26 NOTE — Patient Instructions (Signed)
Please review the attached list of medications and notify my office if there are any errors.   Please bring all of your medications to every appointment so we can make sure that our medication list is the same as yours.   You are overdue for a test for colon polyps. This is the most effective way to prevent colon cancer. I strongly recommended a stool test to screen for this. Let me know if you want to have it done and we'll send you a stool collection kit 

## 2021-10-17 ENCOUNTER — Other Ambulatory Visit: Payer: Self-pay | Admitting: Physician Assistant

## 2021-10-17 DIAGNOSIS — E119 Type 2 diabetes mellitus without complications: Secondary | ICD-10-CM

## 2021-10-17 DIAGNOSIS — I10 Essential (primary) hypertension: Secondary | ICD-10-CM

## 2021-11-23 DIAGNOSIS — E119 Type 2 diabetes mellitus without complications: Secondary | ICD-10-CM | POA: Diagnosis not present

## 2021-11-23 DIAGNOSIS — H524 Presbyopia: Secondary | ICD-10-CM | POA: Diagnosis not present

## 2022-01-09 ENCOUNTER — Ambulatory Visit: Payer: BC Managed Care – PPO | Admitting: Family Medicine

## 2022-01-09 ENCOUNTER — Encounter: Payer: Self-pay | Admitting: Family Medicine

## 2022-01-09 VITALS — BP 113/69 | HR 83 | Temp 97.5°F | Resp 16 | Wt 187.0 lb

## 2022-01-09 DIAGNOSIS — Z125 Encounter for screening for malignant neoplasm of prostate: Secondary | ICD-10-CM

## 2022-01-09 DIAGNOSIS — Z23 Encounter for immunization: Secondary | ICD-10-CM | POA: Diagnosis not present

## 2022-01-09 DIAGNOSIS — E119 Type 2 diabetes mellitus without complications: Secondary | ICD-10-CM

## 2022-01-09 LAB — HM DIABETES EYE EXAM

## 2022-01-09 LAB — POCT GLYCOSYLATED HEMOGLOBIN (HGB A1C)
Est. average glucose Bld gHb Est-mCnc: 209
Hemoglobin A1C: 8.9 % — AB (ref 4.0–5.6)

## 2022-01-09 NOTE — Patient Instructions (Addendum)
Please review the attached list of medications and notify my office if there are any errors.   Please go to the lab draw station in [address] on the second floor of [facility]  when you are fasting for 8 hours. Normal hours are 8:00am to 11:30am and 1:00pm to 4:00pm Monday through Friday   The lab is closed on Thursday and Friday November 23 and November 24th

## 2022-01-09 NOTE — Progress Notes (Signed)
I,Roshena L Chambers,acting as a scribe for Lelon Huh, MD.,have documented all relevant documentation on the behalf of Lelon Huh, MD,as directed by  Lelon Huh, MD while in the presence of Lelon Huh, MD.    Established patient visit   Patient: Alejandro Marquez   DOB: Oct 31, 1955   66 y.o. Male  MRN: [id number] Visit Date: 01/09/2022  Today's healthcare provider: Lelon Huh, MD   Chief Complaint  Patient presents with   Diabetes   Subjective    HPI  Diabetes Mellitus Type II, Follow-up  Lab Results  Component Value Date   HGBA1C 8.9 (A) 01/09/2022   HGBA1C 7.9 (A) 08/26/2021   HGBA1C 8.6 (A) 05/03/2021   Wt Readings from Last 3 Encounters:  01/09/22 187 lb (84.8 kg)  08/26/21 174 lb (78.9 kg)  05/03/21 183 lb (83 kg)   Last seen for diabetes 4 months ago.  Management since then includes continuing healthy diet and same medications.  However he states he has not been eating healthy or exercising as much as he usually does.  He reports good compliance with treatment. He is not having side effects.  Symptoms: No fatigue No foot ulcerations  No appetite changes No nausea  No paresthesia of the feet  No polydipsia  No polyuria No visual disturbances   No vomiting     Home blood sugar records:  blood sugars are not checked  Episodes of hypoglycemia? No    Current insulin regiment: none Most Recent Eye Exam: not UTD Current exercise: none Current diet habits: in general, an "unhealthy" diet  Pertinent Labs: Lab Results  Component Value Date   CHOL 63 (L) 08/24/2020   HDL 34 (L) 08/24/2020   LDLCALC 16 08/24/2020   TRIG 49 08/24/2020   CHOLHDL 2.0 06/25/2019   Lab Results  Component Value Date   NA 141 12/10/2020   K 4.8 12/10/2020   CREATININE 1.02 12/10/2020   EGFR 83 12/10/2020   MICROALBUR 50 09/25/2018   LABMICR 9.7 05/03/2021      ---------------------------------------------------------------------------------------------------   Medications: Outpatient Medications Prior to Visit  Medication Sig   glipiZIDE (GLUCOTROL) 10 MG tablet TAKE 1 TABLET (10 MG TOTAL) BY MOUTH TWICE A DAY BEFORE A MEAL   lisinopril (ZESTRIL) 10 MG tablet TAKE 1 TABLET BY MOUTH EVERY DAY   metFORMIN (GLUCOPHAGE) 1000 MG tablet TAKE 1 TABLET (1,000 MG TOTAL) BY MOUTH TWICE A DAY WITH FOOD   No facility-administered medications prior to visit.    Review of Systems  Constitutional:  Negative for appetite change, chills and fever.  Respiratory:  Negative for chest tightness, shortness of breath and wheezing.   Cardiovascular:  Negative for chest pain and palpitations.  Gastrointestinal:  Negative for abdominal pain, nausea and vomiting.       Objective    BP 113/69 (BP Location: Left Arm, Patient Position: Sitting, Cuff Size: Large)   Pulse 83   Temp (!) 97.5 F (36.4 C) (Oral)   Resp 16   Wt 187 lb (84.8 kg)   SpO2 99% Comment: room air  BMI 26.08 kg/m    Physical Exam   General appearance: Well developed, well nourished male, cooperative and in no acute distress Head: Normocephalic, without obvious abnormality, atraumatic Respiratory: Respirations even and unlabored, normal respiratory rate Extremities: All extremities are intact.  Skin: Skin color, texture, turgor normal. No rashes seen  Psych: Appropriate mood and affect. Neurologic: Mental status: Alert, oriented to person, place, and time, thought content appropriate.  Results for orders placed or performed in visit on 01/09/22  POCT HgB A1C  Result Value Ref Range   Hemoglobin A1C 8.9 (A) 4.0 - 5.6 %   Est. average glucose Bld gHb Est-mCnc 209     Assessment & Plan     1. Type 2 diabetes mellitus without complication, without long-term current use of insulin ([place]) Significantly worse since last visit, also noted to have gained 13 pounds. He wants to work on  diet and losing weight, but advised will need additional medications if not improved at follow up in 4 months.  - CBC - Comprehensive metabolic panel - Lipid panel  2. Need for immunization against influenza  - Flu Vaccine QUAD 55moIM (Fluarix, Fluzone & Alfiuria Quad PF)  3. Prostate cancer screening  - PSA Total (Reflex To Free) ([facility] only)      The entirety of the information documented in the History of Present Illness, Review of Systems and Physical Exam were personally obtained by me. Portions of this information were initially documented by the CMA and reviewed by me for thoroughness and accuracy.     DLelon Huh MD  [phone](phone) [phone](fax)  [place]

## 2022-01-10 ENCOUNTER — Encounter: Payer: Self-pay | Admitting: Family Medicine

## 2022-01-11 ENCOUNTER — Other Ambulatory Visit: Payer: Self-pay | Admitting: Family Medicine

## 2022-01-11 DIAGNOSIS — E119 Type 2 diabetes mellitus without complications: Secondary | ICD-10-CM

## 2022-01-11 NOTE — Telephone Encounter (Signed)
Requested Prescriptions  Pending Prescriptions Disp Refills   glipiZIDE (GLUCOTROL) 10 MG tablet [Pharmacy Med Name: GLIPIZIDE 10 MG TABLET] 180 tablet 0    Sig: TAKE 1 TABLET (10 MG TOTAL) BY MOUTH TWICE A DAY BEFORE A MEAL     Endocrinology:  Diabetes - Sulfonylureas Failed - 01/11/2022  2:02 AM      Failed - HBA1C is between 0 and 7.9 and within 180 days    Hemoglobin A1C  Date Value Ref Range Status  01/09/2022 8.9 (A) 4.0 - 5.6 % Final   Hgb A1c MFr Bld  Date Value Ref Range Status  12/10/2020 6.8 (H) 4.8 - 5.6 % Final    Comment:             Prediabetes: 5.7 - 6.4          Diabetes: >6.4          Glycemic control for adults with diabetes: <7.0          Failed - Cr in normal range and within 360 days    Creat  Date Value Ref Range Status  01/12/2017 0.83 0.70 - 1.33 mg/dL Final    Comment:    For patients >60 years of age, the reference limit for Creatinine is approximately 13% higher for people identified as African-American. .    Creatinine, Ser  Date Value Ref Range Status  12/10/2020 1.02 0.76 - 1.27 mg/dL Final         Passed - Valid encounter within last 6 months    Recent Outpatient Visits           2 days ago Type 2 diabetes mellitus without complication, without long-term current use of insulin (HCC)   [facility] Malva Limes, MD   4 months ago Type 2 diabetes mellitus without complication, without long-term current use of insulin [facility])   [facility] Malva Limes, MD   8 months ago Type 2 diabetes mellitus without complication, without long-term current use of insulin [facility])   [facility] Malva Limes, MD   11 months ago Thrombocytopenia [facility])   [facility] Malva Limes, MD   1 year ago Hypertension, unspecified type   [facility] Chrismon, Jodell Cipro, PA-C       Future Appointments             In 4 months Fisher, Demetrios Isaacs, MD [facility], [facility]

## 2022-01-18 DIAGNOSIS — E119 Type 2 diabetes mellitus without complications: Secondary | ICD-10-CM | POA: Diagnosis not present

## 2022-01-18 DIAGNOSIS — Z125 Encounter for screening for malignant neoplasm of prostate: Secondary | ICD-10-CM | POA: Diagnosis not present

## 2022-01-19 LAB — CBC
Hematocrit: 43.3 % (ref 37.5–51.0)
Hemoglobin: 14.8 g/dL (ref 13.0–17.7)
MCH: 30.6 pg (ref 26.6–33.0)
MCHC: 34.2 g/dL (ref 31.5–35.7)
MCV: 90 fL (ref 79–97)
Platelets: 67 10*3/uL — CL (ref 150–450)
RBC: 4.84 x10E6/uL (ref 4.14–5.80)
RDW: 11.8 % (ref 11.6–15.4)
WBC: 3.9 10*3/uL (ref 3.4–10.8)

## 2022-01-19 LAB — COMPREHENSIVE METABOLIC PANEL
ALT: 68 IU/L — ABNORMAL HIGH (ref 0–44)
AST: 52 IU/L — ABNORMAL HIGH (ref 0–40)
Albumin/Globulin Ratio: 1.4 (ref 1.2–2.2)
Albumin: 4.2 g/dL (ref 3.9–4.9)
Alkaline Phosphatase: 75 IU/L (ref 44–121)
BUN/Creatinine Ratio: 17 (ref 10–24)
BUN: 16 mg/dL (ref 8–27)
Bilirubin Total: 0.8 mg/dL (ref 0.0–1.2)
CO2: 23 mmol/L (ref 20–29)
Calcium: 9.4 mg/dL (ref 8.6–10.2)
Chloride: 100 mmol/L (ref 96–106)
Creatinine, Ser: 0.93 mg/dL (ref 0.76–1.27)
Globulin, Total: 2.9 g/dL (ref 1.5–4.5)
Glucose: 297 mg/dL — ABNORMAL HIGH (ref 70–99)
Potassium: 4.6 mmol/L (ref 3.5–5.2)
Sodium: 136 mmol/L (ref 134–144)
Total Protein: 7.1 g/dL (ref 6.0–8.5)
eGFR: 92 mL/min/{1.73_m2} (ref >59)

## 2022-01-19 LAB — LIPID PANEL
Chol/HDL Ratio: 1.9 ratio (ref 0.0–5.0)
Cholesterol, Total: 65 mg/dL — ABNORMAL LOW (ref 100–199)
HDL: 34 mg/dL — ABNORMAL LOW (ref >39)
LDL Chol Calc (NIH): 18 mg/dL (ref 0–99)
Triglycerides: 50 mg/dL (ref 0–149)
VLDL Cholesterol Cal: 13 mg/dL (ref 5–40)

## 2022-01-19 LAB — PSA TOTAL (REFLEX TO FREE): Prostate Specific Ag, Serum: 2.3 ng/mL (ref 0.0–4.0)

## 2022-01-22 ENCOUNTER — Other Ambulatory Visit: Payer: Self-pay | Admitting: Family Medicine

## 2022-01-22 DIAGNOSIS — R7401 Elevation of levels of liver transaminase levels: Secondary | ICD-10-CM

## 2022-01-22 DIAGNOSIS — D696 Thrombocytopenia, unspecified: Secondary | ICD-10-CM

## 2022-01-23 ENCOUNTER — Telehealth: Payer: Self-pay | Admitting: Family Medicine

## 2022-01-23 NOTE — Telephone Encounter (Signed)
That's fine

## 2022-01-23 NOTE — Telephone Encounter (Signed)
Copied from CRM [id number]. Topic: General - Other >> Jan 23, 2022  9:35 AM Tiffany B wrote: If PCP would like imaging on liver and spleen orders need to reflect abdominal complete. Caller states she has access to EPIC.

## 2022-01-23 NOTE — Telephone Encounter (Signed)
Returned call to [employer]. Alejandro Marquez will change order. Provider may need to sign off on the order.

## 2022-01-30 ENCOUNTER — Ambulatory Visit
Admission: RE | Admit: 2022-01-30 | Discharge: 2022-01-30 | Disposition: A | Discharge status: Home / Self Care | Payer: BC Managed Care – PPO | Source: Ambulatory Visit | Attending: Family Medicine | Admitting: Family Medicine

## 2022-01-30 ENCOUNTER — Other Ambulatory Visit: Payer: Self-pay | Admitting: Family Medicine

## 2022-01-30 DIAGNOSIS — D696 Thrombocytopenia, unspecified: Secondary | ICD-10-CM | POA: Insufficient documentation

## 2022-01-30 DIAGNOSIS — R7401 Elevation of levels of liver transaminase levels: Secondary | ICD-10-CM | POA: Insufficient documentation

## 2022-01-30 DIAGNOSIS — K828 Other specified diseases of gallbladder: Secondary | ICD-10-CM | POA: Diagnosis not present

## 2022-01-30 DIAGNOSIS — R932 Abnormal findings on diagnostic imaging of liver and biliary tract: Secondary | ICD-10-CM

## 2022-02-03 DIAGNOSIS — D696 Thrombocytopenia, unspecified: Secondary | ICD-10-CM | POA: Diagnosis not present

## 2022-02-03 DIAGNOSIS — R932 Abnormal findings on diagnostic imaging of liver and biliary tract: Secondary | ICD-10-CM | POA: Diagnosis not present

## 2022-02-03 DIAGNOSIS — R7401 Elevation of levels of liver transaminase levels: Secondary | ICD-10-CM | POA: Diagnosis not present

## 2022-02-05 ENCOUNTER — Other Ambulatory Visit: Payer: Self-pay | Admitting: Family Medicine

## 2022-02-05 DIAGNOSIS — D696 Thrombocytopenia, unspecified: Secondary | ICD-10-CM

## 2022-02-05 DIAGNOSIS — R7401 Elevation of levels of liver transaminase levels: Secondary | ICD-10-CM

## 2022-02-05 DIAGNOSIS — R768 Other specified abnormal immunological findings in serum: Secondary | ICD-10-CM

## 2022-02-11 LAB — ANA W/REFLEX IF POSITIVE
Anti JO-1: <0.2 AI (ref 0.0–0.9)
Anti Nuclear Antibody (ANA): POSITIVE — AB
Centromere Ab Screen: <0.2 AI (ref 0.0–0.9)
Chromatin Ab SerPl-aCnc: <0.2 AI (ref 0.0–0.9)
ENA RNP Ab: <0.2 AI (ref 0.0–0.9)
ENA SM Ab Ser-aCnc: <0.2 AI (ref 0.0–0.9)
ENA SSA (RO) Ab: <0.2 AI (ref 0.0–0.9)
ENA SSB (LA) Ab: <0.2 AI (ref 0.0–0.9)
Scleroderma (Scl-70) (ENA) Antibody, IgG: <0.2 AI (ref 0.0–0.9)
dsDNA Ab: 12 IU/mL — ABNORMAL HIGH (ref 0–9)

## 2022-02-11 LAB — HEPATITIS B SURFACE ANTIGEN: Hepatitis B Surface Ag: NEGATIVE

## 2022-02-11 LAB — HEPATITIS B CORE ANTIBODY, IGM: Hep B C IgM: NEGATIVE

## 2022-02-11 LAB — IRON,TIBC AND FERRITIN PANEL
Ferritin: 263 ng/mL (ref 30–400)
Iron Saturation: 20 % (ref 15–55)
Iron: 67 ug/dL (ref 38–169)
Total Iron Binding Capacity: 339 ug/dL (ref 250–450)
UIBC: 272 ug/dL (ref 111–343)

## 2022-02-11 LAB — COPPER, SERUM: Copper: 99 ug/dL (ref 69–132)

## 2022-02-11 LAB — ALPHA-1-ANTITRYPSIN: A-1 Antitrypsin: 121 mg/dL (ref 101–187)

## 2022-03-08 ENCOUNTER — Other Ambulatory Visit: Payer: Self-pay | Admitting: Physician Assistant

## 2022-03-08 DIAGNOSIS — E119 Type 2 diabetes mellitus without complications: Secondary | ICD-10-CM

## 2022-04-18 DIAGNOSIS — R768 Other specified abnormal immunological findings in serum: Secondary | ICD-10-CM | POA: Diagnosis not present

## 2022-04-18 DIAGNOSIS — R21 Rash and other nonspecific skin eruption: Secondary | ICD-10-CM | POA: Diagnosis not present

## 2022-04-18 DIAGNOSIS — R7989 Other specified abnormal findings of blood chemistry: Secondary | ICD-10-CM | POA: Diagnosis not present

## 2022-04-18 DIAGNOSIS — D696 Thrombocytopenia, unspecified: Secondary | ICD-10-CM | POA: Diagnosis not present

## 2022-04-21 DIAGNOSIS — L0101 Non-bullous impetigo: Secondary | ICD-10-CM | POA: Diagnosis not present

## 2022-04-26 ENCOUNTER — Other Ambulatory Visit: Payer: Self-pay | Admitting: Family Medicine

## 2022-04-26 DIAGNOSIS — I10 Essential (primary) hypertension: Secondary | ICD-10-CM

## 2022-05-12 ENCOUNTER — Ambulatory Visit: Payer: BC Managed Care – PPO | Admitting: Family Medicine

## 2022-05-12 VITALS — BP 117/67 | HR 86 | Wt 186.0 lb

## 2022-05-12 DIAGNOSIS — E119 Type 2 diabetes mellitus without complications: Secondary | ICD-10-CM

## 2022-05-12 LAB — POCT GLYCOSYLATED HEMOGLOBIN (HGB A1C)
Est. average glucose Bld gHb Est-mCnc: 206
Hemoglobin A1C: 8.8 % — AB (ref 4.0–5.6)

## 2022-05-12 MED ORDER — SITAGLIPTIN PHOSPHATE 100 MG PO TABS: 100.0000 mg | ORAL_TABLET | Freq: Every day | ORAL | 3 refills | Status: DC

## 2022-05-12 NOTE — Patient Instructions (Addendum)
Please review the attached list of medications and notify my office if there are any errors.   Please bring all of your medications to every appointment so we can make sure that our medication list is the same as yours.   Call [facility] at [phone] for directions to their office

## 2022-05-12 NOTE — Progress Notes (Signed)
Alejandro Marquez,acting as a scribe for Lelon Huh, MD.,have documented all relevant documentation on the behalf of Lelon Huh, MD,as directed by  Lelon Huh, MD while in the presence of Lelon Huh, MD.     Established patient visit   Patient: Alejandro Marquez   DOB: 10-Dec-1955   67 y.o. Male  MRN: [id number] Visit Date: 05/12/2022  Today's healthcare provider: Lelon Huh, MD   Chief Complaint  Patient presents with   Diabetes   Subjective    HPI  Diabetes Mellitus Type II, Follow-up  Lab Results  Component Value Date   HGBA1C 8.9 (A) 01/09/2022   HGBA1C 7.9 (A) 08/26/2021   HGBA1C 8.6 (A) 05/03/2021   Wt Readings from Last 3 Encounters:  05/12/22 186 lb (84.4 kg)  01/09/22 187 lb (84.8 kg)  08/26/21 174 lb (78.9 kg)   Last seen for diabetes 3 months ago.  Management since then includes no changes. He reports good compliance with treatment. He is not having side effects.  Symptoms: No fatigue No foot ulcerations  No appetite changes No nausea  No paresthesia of the feet  No polydipsia  No polyuria No visual disturbances   No vomiting     Home blood sugar records: Not being checked  Episodes of hypoglycemia? No    Current insulin regiment: NA Most Recent Eye Exam: 01/09/22 Current exercise: walking Current diet habits: Regular  Pertinent Labs: Lab Results  Component Value Date   CHOL 65 (L) 01/18/2022   HDL 34 (L) 01/18/2022   LDLCALC 18 01/18/2022   TRIG 50 01/18/2022   CHOLHDL 1.9 01/18/2022   Lab Results  Component Value Date   NA 136 01/18/2022   K 4.6 01/18/2022   CREATININE 0.93 01/18/2022   EGFR 92 01/18/2022   LABMICR 9.7 05/03/2021   MICRALBCREAT 6 05/03/2021     ---------------------------------------------------------------------------------------------------   Medications: Outpatient Medications Prior to Visit  Medication Sig   glipiZIDE (GLUCOTROL) 10 MG tablet TAKE 1 TABLET (10 MG TOTAL) BY MOUTH TWICE A DAY  BEFORE A MEAL   lisinopril (ZESTRIL) 10 MG tablet TAKE 1 TABLET BY MOUTH EVERY DAY   metFORMIN (GLUCOPHAGE) 1000 MG tablet TAKE 1 TABLET (1,000 MG TOTAL) BY MOUTH TWICE A DAY WITH FOOD   No facility-administered medications prior to visit.         Objective    BP 117/67 (BP Location: Left Arm, Patient Position: Sitting, Cuff Size: Normal)   Pulse 86   Wt 186 lb (84.4 kg)   SpO2 99%   BMI 25.94 kg/m   Physical Exam   General appearance: Well developed, well nourished male, cooperative and in no acute distress Head: Normocephalic, without obvious abnormality, atraumatic Respiratory: Respirations even and unlabored, normal respiratory rate Extremities: All extremities are intact.  Skin: Skin color, texture, turgor normal. No rashes seen  Psych: Appropriate mood and affect. Neurologic: Mental status: Alert, oriented to person, place, and time, thought content appropriate.   Results for orders placed or performed in visit on 05/12/22  POCT glycosylated hemoglobin (Hb A1C)  Result Value Ref Range   Hemoglobin A1C 8.8 (A) 4.0 - 5.6 %   Est. average glucose Bld gHb Est-mCnc 206     Assessment & Plan     1. Type 2 diabetes mellitus without complication, without long-term current use of insulin (HCC) Compliant with medications, but uncontrolled. Will add sitaGLIPtin (JANUVIA) 100 MG tablet; Take 1 tablet (100 mg total) by mouth daily.  Dispense: 30 tablet; Refill: 3  Future Appointments  Date Time Provider [place]  05/25/2022  3:30 PM Jonathon Bellows, MD [facility] None  07/14/2022  4:00 PM Caryn Section Kirstie Peri, MD [facility]        The entirety of the information documented in the History of Present Illness, Review of Systems and Physical Exam were personally obtained by me. Portions of this information were initially documented by the CMA and reviewed by me for thoroughness and accuracy.     Lelon Huh, MD  [place] [phone]  (phone) [phone] (fax)  [place]

## 2022-05-18 ENCOUNTER — Other Ambulatory Visit: Payer: Self-pay | Admitting: Family Medicine

## 2022-05-18 DIAGNOSIS — E119 Type 2 diabetes mellitus without complications: Secondary | ICD-10-CM

## 2022-05-24 ENCOUNTER — Other Ambulatory Visit: Payer: Self-pay | Admitting: Family Medicine

## 2022-05-24 DIAGNOSIS — I10 Essential (primary) hypertension: Secondary | ICD-10-CM

## 2022-05-24 NOTE — Telephone Encounter (Signed)
Requested Prescriptions  Pending Prescriptions Disp Refills   lisinopril (ZESTRIL) 10 MG tablet [Pharmacy Med Name: LISINOPRIL 10 MG TABLET] 90 tablet 0    Sig: TAKE 1 TABLET BY MOUTH EVERY DAY     Cardiovascular:  ACE Inhibitors Passed - 05/24/2022  2:21 AM      Passed - Cr in normal range and within 180 days    Creat  Date Value Ref Range Status  01/12/2017 0.83 0.70 - 1.33 mg/dL Final    Comment:    For patients >65 years of age, the reference limit for Creatinine is approximately 13% higher for people identified as African-American. .    Creatinine, Ser  Date Value Ref Range Status  01/18/2022 0.93 0.76 - 1.27 mg/dL Final         Passed - K in normal range and within 180 days    Potassium  Date Value Ref Range Status  01/18/2022 4.6 3.5 - 5.2 mmol/L Final         Passed - Patient is not pregnant      Passed - Last BP in normal range    BP Readings from Last 1 Encounters:  05/12/22 117/67         Passed - Valid encounter within last 6 months    Recent Outpatient Visits           1 week ago Type 2 diabetes mellitus without complication, without long-term current use of insulin ([place])   [place], Donald E, MD   4 months ago Type 2 diabetes mellitus without complication, without long-term current use of insulin ([place])   [place] Birdie Sons, MD   9 months ago Type 2 diabetes mellitus without complication, without long-term current use of insulin ([place])   [place], Donald E, MD   1 year ago Type 2 diabetes mellitus without complication, without long-term current use of insulin ([place])   [place] Birdie Sons, MD   1 year ago Thrombocytopenia [facility])   [place], Donald E, MD       Future Appointments             In 1 month Fisher, Kirstie Peri, MD [facility], [facility]

## 2022-05-25 ENCOUNTER — Encounter: Payer: Self-pay | Admitting: Gastroenterology

## 2022-05-25 ENCOUNTER — Other Ambulatory Visit: Payer: Self-pay

## 2022-05-25 ENCOUNTER — Ambulatory Visit: Payer: BC Managed Care – PPO | Admitting: Gastroenterology

## 2022-05-25 VITALS — BP 127/77 | HR 96 | Temp 97.1°F | Ht 71.0 in | Wt 182.2 lb

## 2022-05-25 DIAGNOSIS — K760 Fatty (change of) liver, not elsewhere classified: Secondary | ICD-10-CM | POA: Diagnosis not present

## 2022-05-25 NOTE — Patient Instructions (Addendum)
Please arrive at the [facility] on 05/31/2022 at 7:45 AM. Please do not eat or drink after midnight the night before your ultrasound.  Nonalcoholic Fatty Liver Disease Diet, Adult Nonalcoholic fatty liver disease is a condition that causes fat to build up in and around the liver. The disease makes it harder for the liver to work the way that it should. Eating a healthy diet of fruits, vegetables, whole grains, lean proteins, and limiting added sugar and fats can help to keep nonalcoholic fatty liver disease under control. It can also help to prevent or improve conditions that are related to the disease, such as heart disease, diabetes, high blood pressure, obesity, and high cholesterol. Along with regular exercise, this diet: Promotes weight loss. Helps to control blood sugar levels. Helps to improve the way that the body uses insulin. What are tips for following this plan? Reading food labels Always check food labels for: The amount of saturated fat in a food. You should limit how much saturated fat you eat. Saturated fat is found in foods that come from animals, including meat and dairy products such as butter, cheese, and whole milk. The amount of fiber in a food. You should choose high-fiber foods such as fruits, vegetables, and whole grains. Try to get 25-30 grams (g) of fiber a day. Added sugar. Avoid foods with a high amount of added sugar and high fructose corn syrup. Avoid sweetened soft drinks, sweetened tea, lemonade, sports drinks, and juices that are not 100% juice. Aim for foods with less than 5 grams of added sugar. Every 4 grams of added sugar is 1 teaspoon (tsp) of sugar per serving. Cooking When cooking, use heart-healthy oils that are high in monounsaturated fats. These include olive oil, canola oil, and avocado oil. Limit frying or deep-frying foods. Cook foods using healthy methods such as baking, boiling, steaming, and grilling instead. Meal planning You may want to keep  track of how many calories you eat and drink. Eating the right amount of calories will help you achieve a healthy weight. Meeting with a dietitian can help you get started. Limit how often you eat takeout and fast food. These foods are usually very high in fat, salt, and sugar. Use the glycemic index (GI) to plan your meals. The index tells you how quickly a food will raise your blood sugar. Choose low-GI foods. Low-GI foods have a GI less than 55. These foods take a longer time to raise blood sugar. A dietitian can help you pick foods that are lower on the GI scale. Try to include some meals each week that replace meat with beans or legumes. Add fish 2-3 times a week, especially heart healthy oily fishes like salmon, sardines, trout, tuna, or mackerel. Lifestyle You may want to follow a Mediterranean diet. This diet includes a lot of vegetables, lean meats or fish, nuts and seeds, whole grains, fruits, and healthy oils and fats. What foods can I eat?  Fruits Apples. Bananas. Pears. Grapes. Papaya. Plums. Kiwi. Grapefruit. Cherries. Strawberries. Vegetables Lettuce. Spinach. Peas. Beets. Cauliflower. Cabbage. Broccoli. Carrots. Tomatoes. Squash. Eggplant. Herbs. Peppers. Onions. Cucumbers. Brussels sprouts. Yams and sweet potatoes. Grains Whole wheat or whole-grain foods, including breads, crackers, cereals, and pasta. Stone-ground whole wheat. Unsweetened oatmeal. Bulgur. Barley. Quinoa. Brown or wild rice. Corn or whole wheat flour tortillas. Meats and other proteins Lean meats. Poultry. Tofu. Seafood and shellfish. Beans. Lentils. Dairy Low-fat or fat-free dairy products, such as yogurt, cottage cheese, or cheese. Beverages Water. Sugar-free drinks.  Tea. Coffee. Low-fat or skim milk. Milk alternatives, such as unsweetened soy, oat, or almond milk. Real fruit juice. Fats and oils Avocado. Canola or olive oil. Nuts and nut butters. Seeds. Seasonings and condiments Mustard. Relish. Low-fat,  low-sugar ketchup and barbecue sauce. Low-fat or fat-free mayonnaise. Sweets and desserts Sugar-free sweets. The items listed above may not be all the foods and drinks you can have. Talk to a dietitian to learn more. What foods should I limit or avoid? Grains White rice. Pasta. Breads. Meats and other proteins Limit red meat to 1-2 times a week. Dairy [employer]. Fats and oils Palm oil and coconut oil. Fried foods. Other foods Processed foods. Foods that contain a lot of salt (sodium) or added sugar. Sweets and desserts Sweets that contain sugar. Bakery items such as cookies, cakes, and other pastries. Beverages Sweetened drinks, such as sweet tea, milkshakes, iced sweet drinks, and sodas. Alcohol. The items listed above may not be all the foods and drinks you should avoid. Talk to a dietitian to learn more. Where to find more information The [employer] of Diabetes and Digestive and Kidney Diseases: [employer] This information is not intended to replace advice given to you by your health care provider. Make sure you discuss any questions you have with your health care provider. Document Revised: 11/21/2021 Document Reviewed: 11/21/2021 Elsevier Patient Education  [place].

## 2022-05-25 NOTE — Addendum Note (Signed)
Addended by: Wayna Chalet on: 05/25/2022 03:41 PM   Modules accepted: Orders

## 2022-05-25 NOTE — Progress Notes (Signed)
Jonathon Bellows MD, MRCP(U.K) [address]  [place], [place] [address]  Main: [phone]  Fax: [phone]   Gastroenterology Consultation  Referring Provider:     Birdie Sons, MD Primary Care Physician:  Birdie Sons, MD Primary Gastroenterologist:  Dr. Jonathon Bellows  Reason for Consultation:     Abnormal function tests        HPI:   Alejandro Marquez is a 67 y.o. y/o male referred for consultation & management  by Dr. Caryn Section, Kirstie Peri, MD.     Initially referred to see me for abnormal liver function tests which was back in November 2020 3 repeat liver function test in February 2024 showed no abnormalities with normal transaminases.  Hemoglobin is also within the normal range at 14.1 a lower platelet count of 119.  Iron studies are within normal limits.  Copper also was normal.  Hepatitis B surface antigen negative.  Ultrasound at that point of time performed showed hepatic steatosis.  No evidence of portal hypertension.   He denies any consumption of alcohol.  He is type II diabetic denies hypertension. Past Medical History:  Diagnosis Date   Diabetes mellitus without complication    Syncope     Past Surgical History:  Procedure Laterality Date   HERNIA REPAIR  1968   HERNIA REPAIR      Prior to Admission medications   Medication Sig Start Date End Date Taking? Authorizing Provider  clobetasol ointment (TEMOVATE) [id number] % Apply 1 Application topically 2 (two) times daily. 04/21/22   [provider]  glipiZIDE (GLUCOTROL) 10 MG tablet TAKE 1 TABLET (10 MG TOTAL) BY MOUTH TWICE A DAY BEFORE A MEAL 01/11/22   Birdie Sons, MD  lisinopril (ZESTRIL) 10 MG tablet TAKE 1 TABLET BY MOUTH EVERY DAY 05/24/22   Birdie Sons, MD  metFORMIN (GLUCOPHAGE) 1000 MG tablet TAKE 1 TABLET (1,000 MG TOTAL) BY MOUTH TWICE A DAY WITH FOOD 05/18/22   Birdie Sons, MD  sitaGLIPtin (JANUVIA) 100 MG tablet Take 1 tablet (100 mg total) by mouth daily. 05/12/22    Birdie Sons, MD    Family History  Problem Relation Age of Onset   Parkinson's disease Father    Diabetes Maternal Grandmother      Social History   Tobacco Use   Smoking status: Never   Smokeless tobacco: Never  Vaping Use   Vaping Use: Never used  Substance Use Topics   Alcohol use: Never   Drug use: Never    Allergies as of 05/25/2022   (No Known Allergies)    Review of Systems:    All systems reviewed and negative except where noted in HPI.   Physical Exam:  BP 127/77   Pulse 96   Temp (!) 97.1 F (36.2 C) (Oral)   Ht 5\' 11"  (1.803 m)   Wt 182 lb 3.2 oz (82.6 kg)   BMI 25.41 kg/m  No LMP for male patient. Psych:  Alert and cooperative. Normal mood and affect. General:   Alert,  Well-developed, well-nourished, pleasant and cooperative in NAD Head:  Normocephalic and atraumatic. Eyes:  Sclera clear, no icterus.   Conjunctiva pink. Abdomen:  Normal bowel sounds.  No bruits.  Soft, non-tender and non-distended without masses, hepatosplenomegaly or hernias noted.  No guarding or rebound tenderness.    Neurologic:  Alert and oriented x3;  grossly normal neurologically. Psych:  Alert and cooperative. Normal mood and affect.  Imaging Studies: No results  found.  Assessment and Plan:   Alejandro Marquez is a 67 y.o. y/o male here to see me for hepatic steatosis seen on ultrasound of the liver.  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  He does not drink any alcohol.  We will obtain an elastography of his liver to determine the level of hepatic fibrosis and if he does have F2 or F3 liver fibrosis he may be eligible for the new drug which has been approved for noncirrhotic NASH  Follow up in 6 months  Dr Jonathon Bellows MD,MRCP(U.K)

## 2022-05-31 ENCOUNTER — Ambulatory Visit
Admission: RE | Admit: 2022-05-31 | Discharge: 2022-05-31 | Disposition: A | Discharge status: Home / Self Care | Payer: BC Managed Care – PPO | Source: Ambulatory Visit | Attending: Gastroenterology | Admitting: Gastroenterology

## 2022-05-31 DIAGNOSIS — K760 Fatty (change of) liver, not elsewhere classified: Secondary | ICD-10-CM | POA: Diagnosis not present

## 2022-06-01 NOTE — Progress Notes (Signed)
Appear as per the radiologist suggestive of early cirrhosis.  Can we get FibroSure blood test please that will give [place] some more information

## 2022-06-02 ENCOUNTER — Telehealth: Payer: Self-pay

## 2022-06-02 DIAGNOSIS — L0101 Non-bullous impetigo: Secondary | ICD-10-CM | POA: Diagnosis not present

## 2022-06-02 DIAGNOSIS — K760 Fatty (change of) liver, not elsewhere classified: Secondary | ICD-10-CM

## 2022-06-02 DIAGNOSIS — L718 Other rosacea: Secondary | ICD-10-CM | POA: Diagnosis not present

## 2022-06-02 NOTE — Telephone Encounter (Signed)
-----   Message from Wyline Mood, MD sent at 06/01/2022  3:05 PM EDT ----- Appear as per the radiologist suggestive of early cirrhosis.  Can we get FibroSure blood test please that will give [place] some more information

## 2022-06-02 NOTE — Telephone Encounter (Signed)
Called patient and left him a detailed message letting him know his ultrasound result was and what Dr. Tobi Bastos wanted from him. I gave him our lab hours and the [facility] inside [facility].

## 2022-06-06 DIAGNOSIS — K760 Fatty (change of) liver, not elsewhere classified: Secondary | ICD-10-CM | POA: Diagnosis not present

## 2022-06-08 LAB — NASH FIBROSURE(R) PLUS
ALPHA 2-MACROGLOBULINS, QN: 284 mg/dL — ABNORMAL HIGH (ref 110–276)
ALT (SGPT) P5P: 41 IU/L (ref 0–55)
AST (SGOT) P5P: 39 IU/L (ref 0–40)
Apolipoprotein A-1: 101 mg/dL (ref 101–178)
Bilirubin, Total: 0.3 mg/dL (ref 0.0–1.2)
Cholesterol, Total: 69 mg/dL — ABNORMAL LOW (ref 100–199)
GGT: 28 IU/L (ref 0–65)
Glucose: 266 mg/dL — ABNORMAL HIGH (ref 70–99)
Haptoglobin: 100 mg/dL (ref 32–363)
Triglycerides: 100 mg/dL (ref 0–149)

## 2022-06-22 NOTE — Progress Notes (Signed)
Possibly has early cirrhosis - will discuss at office visit- please ensure he has one non urgent

## 2022-07-07 NOTE — Progress Notes (Signed)
[facility] Quality Team Note  Name: Alejandro Marquez Date of Birth: November 06, 1955 MRN: [id number] Date: 07/07/2022  [facility] Quality Team has reviewed this patient's chart, please see recommendations below:  [facility] Quality Other; (PATIENT DUE FOR COLON CANCER SCREENING, PLEASE ADDRESS AT UPCOMING OFFICE VISIT WITH PCP 07/14/2022. MOST RECENT A1C OUT OF RANGE, COULD NOT CLOSE GAP. WILL NEED ANOTHER A1C BEFORE 02/20/2023 LESS THAN 8.0 TO CLOSE GAP)

## 2022-07-13 NOTE — Progress Notes (Signed)
I,Sulibeya S Dimas,acting as a scribe for Mila Merry, MD.,have documented all relevant documentation on the behalf of Mila Merry, MD,as directed by  Mila Merry, MD      Established patient visit   Patient: Alejandro Marquez   DOB: [birth year]   67 y.o. Male  MRN: [id number] Visit Date: 07/14/2022  Today's healthcare provider: Mila Merry, MD   Chief Complaint  Patient presents with   Diabetes   Subjective    HPI  Diabetes Mellitus Type II, Follow-up  Lab Results  Component Value Date   HGBA1C 7.0 (A) 07/14/2022   HGBA1C 8.8 (A) 05/12/2022   HGBA1C 8.9 (A) 01/09/2022   Wt Readings from Last 3 Encounters:  07/14/22 186 lb 3.2 oz (84.5 kg)  05/25/22 182 lb 3.2 oz (82.6 kg)  05/12/22 186 lb (84.4 kg)   Last seen for diabetes 2 months ago.  Management since then includes add januvia 100 mg daily. He reports excellent compliance with treatment. He is not having side effects.   Home blood sugar records: fasting range: not being checked  Episodes of hypoglycemia? No    Current insulin regiment: none Most Recent Eye Exam: utd   Pertinent Labs: Lab Results  Component Value Date   CHOL 69 (L) 06/06/2022   HDL 34 (L) 01/18/2022   LDLCALC 18 01/18/2022   TRIG 100 06/06/2022   CHOLHDL 1.9 01/18/2022   Lab Results  Component Value Date   NA 136 01/18/2022   K 4.6 01/18/2022   CREATININE 0.93 01/18/2022   EGFR 92 01/18/2022   LABMICR 9.7 05/03/2021   MICRALBCREAT 6 05/03/2021     ---------------------------------------------------------------------------------------------------   Medications: Outpatient Medications Prior to Visit  Medication Sig   clobetasol ointment (TEMOVATE) 0.05 % Apply 1 Application topically 2 (two) times daily.   glipiZIDE (GLUCOTROL) 10 MG tablet TAKE 1 TABLET (10 MG TOTAL) BY MOUTH TWICE A DAY BEFORE A MEAL   lisinopril (ZESTRIL) 10 MG tablet TAKE 1 TABLET BY MOUTH EVERY DAY   metFORMIN (GLUCOPHAGE) 1000 MG tablet TAKE 1  TABLET (1,000 MG TOTAL) BY MOUTH TWICE A DAY WITH FOOD   sitaGLIPtin (JANUVIA) 100 MG tablet Take 1 tablet (100 mg total) by mouth daily.   No facility-administered medications prior to visit.    Review of Systems  Constitutional:  Negative for appetite change and fatigue.  Eyes:  Negative for visual disturbance.  Respiratory:  Negative for cough, chest tightness and shortness of breath.   Cardiovascular:  Negative for chest pain and leg swelling.  Gastrointestinal:  Negative for abdominal pain, nausea and vomiting.        Objective    BP 110/65 (BP Location: Right Arm, Patient Position: Sitting, Cuff Size: Large)   Pulse 78   Temp 97.9 F (36.6 C) (Temporal)   Resp 16   Wt 186 lb 3.2 oz (84.5 kg)   SpO2 97%   BMI 25.97 kg/m  BP Readings from Last 3 Encounters:  07/14/22 110/65  05/25/22 127/77  05/12/22 117/67   Wt Readings from Last 3 Encounters:  07/14/22 186 lb 3.2 oz (84.5 kg)  05/25/22 182 lb 3.2 oz (82.6 kg)  05/12/22 186 lb (84.4 kg)    Results for orders placed or performed in visit on 07/14/22  POCT glycosylated hemoglobin (Hb A1C)  Result Value Ref Range   Hemoglobin A1C 7.0 (A) 4.0 - 5.6 %   Est. average glucose Bld gHb Est-mCnc 154     Assessment & Plan  1. Type 2 diabetes mellitus without complication, without long-term current use of insulin (HCC) Much better with addition of sitagliptin. Continue current medications.   Future Appointments  Date Time Provider Department Center  07/25/2022  2:15 PM Wyline Mood, MD [facility] None  11/17/2022  4:00 PM Sherrie Mustache Demetrios Isaacs, MD [facility]     2. Liver disease, chronic Enforced importance of following up with GI in light of findings on recent elastography      The entirety of the information documented in the History of Present Illness, Review of Systems and Physical Exam were personally obtained by me. Portions of this information were initially documented by the CMA and reviewed by me for thoroughness  and accuracy.     Mila Merry, MD  [facility] [phone] (phone) [phone] (fax)  [facility]

## 2022-07-14 ENCOUNTER — Ambulatory Visit: Payer: BC Managed Care – PPO | Admitting: Family Medicine

## 2022-07-14 ENCOUNTER — Encounter: Payer: Self-pay | Admitting: Family Medicine

## 2022-07-14 VITALS — BP 110/65 | HR 78 | Temp 97.9°F | Resp 16 | Wt 186.2 lb

## 2022-07-14 DIAGNOSIS — K769 Liver disease, unspecified: Secondary | ICD-10-CM

## 2022-07-14 DIAGNOSIS — E119 Type 2 diabetes mellitus without complications: Secondary | ICD-10-CM | POA: Diagnosis not present

## 2022-07-14 LAB — POCT GLYCOSYLATED HEMOGLOBIN (HGB A1C)
Est. average glucose Bld gHb Est-mCnc: 154
Hemoglobin A1C: 7 % — AB (ref 4.0–5.6)

## 2022-07-14 NOTE — Patient Instructions (Signed)
.   Please review the attached list of medications and notify my office if there are any errors.   . Please bring all of your medications to every appointment so we can make sure that our medication list is the same as yours.   

## 2022-07-19 ENCOUNTER — Other Ambulatory Visit: Payer: Self-pay | Admitting: Family Medicine

## 2022-07-19 DIAGNOSIS — E119 Type 2 diabetes mellitus without complications: Secondary | ICD-10-CM

## 2022-07-25 ENCOUNTER — Ambulatory Visit (INDEPENDENT_AMBULATORY_CARE_PROVIDER_SITE_OTHER): Payer: BC Managed Care – PPO | Admitting: Gastroenterology

## 2022-07-25 ENCOUNTER — Encounter: Payer: Self-pay | Admitting: Gastroenterology

## 2022-07-25 VITALS — BP 126/73 | HR 89 | Temp 98.5°F | Wt 185.6 lb

## 2022-07-25 DIAGNOSIS — K7400 Hepatic fibrosis, unspecified: Secondary | ICD-10-CM | POA: Diagnosis not present

## 2022-07-25 NOTE — Progress Notes (Signed)
Alejandro Mood MD, MRCP(U.K) [address]  [address]  [place], [place] [address]  Main: [phone]  Fax: [phone]   Primary Care Physician: Malva Limes, MD  Primary Gastroenterologist:  Dr. Wyline Marquez   Chief Complaint  Patient presents with   Fatty liver    HPI: Alejandro Marquez is a 67 y.o. male   Summary of history :   Initially referred and seen in April 2024 for abnormal liver function testswhich was back in November 2020 3 repeat liver function test in February 2024 showed no abnormalities with normal transaminases. Hemoglobin is also within the normal range at 14.1 a lower platelet count of 119. Iron studies are within normal limits. Copper also was normal. Hepatitis B surface antigen negative. Ultrasound at that point of time performed showed hepatic steatosis. No evidence of portal hypertension.    Interval history   05/25/2022-07/25/2022  06/06/2022 NASH FibroSure scan: There were unable to complete evaluation with a FibroSure test due to elevated alpha-2 microglobulin's   05/31/2022 ultrasound elastography increased echotexture to the liver compatible fatty infiltration nodularity suggest early cirrhosis median K PA was 4.4.  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  Denies any alcohol exercising.  He said he has had central obesity for 35 years.  Parents also had central obesity.  He does not have money for any further testing.  Current Outpatient Medications  Medication Sig Dispense Refill   clobetasol ointment (TEMOVATE) 0.05 % Apply 1 Application topically 2 (two) times daily.     glipiZIDE (GLUCOTROL) 10 MG tablet TAKE 1 TABLET (10 MG TOTAL) BY MOUTH TWICE A DAY BEFORE A MEAL 60 tablet 2   lisinopril (ZESTRIL) 10 MG tablet TAKE 1 TABLET BY MOUTH EVERY DAY 90 tablet 0   metFORMIN (GLUCOPHAGE) 1000 MG tablet TAKE 1 TABLET (1,000 MG TOTAL) BY MOUTH TWICE A DAY WITH FOOD 60 tablet 11   sitaGLIPtin (JANUVIA) 100 MG tablet Take 1  tablet (100 mg total) by mouth daily. 30 tablet 3   No current facility-administered medications for this visit.    Allergies as of 07/25/2022   (No Known Allergies)     ROS:  General: Negative for anorexia, weight loss, fever, chills, fatigue, weakness. ENT: Negative for hoarseness, difficulty swallowing , nasal congestion. CV: Negative for chest pain, angina, palpitations, dyspnea on exertion, peripheral edema.  Respiratory: Negative for dyspnea at rest, dyspnea on exertion, cough, sputum, wheezing.  GI: See history of present illness. GU:  Negative for dysuria, hematuria, urinary incontinence, urinary frequency, nocturnal urination.  Endo: Negative for unusual weight change.    Physical Examination:   BP 126/73   Pulse 89   Temp 98.5 F (36.9 C) (Oral)   Wt 185 lb 9.6 oz (84.2 kg)   BMI 25.89 kg/m   General: Well-nourished, well-developed in no acute distress.  Eyes: No icterus. Conjunctivae pink. Mouth: Oropharyngeal mucosa moist and pink , no lesions erythema or exudate. Spider angiomata over the chest Abdomen: Bowel sounds are normal, nontender, nondistended, no hepatosplenomegaly or masses, no abdominal bruits or hernia , no rebound or guarding.   Extremities: No lower extremity edema. No clubbing or deformities. Neuro: Alert and oriented x 3.  Grossly intact. Skin: Warm and dry, no jaundice.   Psych: Alert and cooperative, normal Marquez and affect.   Imaging Studies: No results found.  Assessment and Plan:   Alejandro Marquez is a 67 y.o. y/o male here to follow-up for fatty liver  disease.  Recent ultrasound elastography indicates nodularity of the liver suggesting of early cirrhosis.  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  No history of excess alcohol consumption.  Will rule out autoimmune causes of liver disease.  Discussed upper endoscopy for esophageal varices screening he does not have the money for the same at this point of time.  We will  see him back in 6 to 8 months to discuss about vaccination and variceal screening. In the interim discussed about healthy eating loss of weight exercise factors to modify fatty liver disease   Dr Alejandro Mood  MD,MRCP [facility]) Follow up in 6 to 8 months

## 2022-07-26 ENCOUNTER — Telehealth: Payer: Self-pay

## 2022-07-26 LAB — HEPATITIS A ANTIBODY, TOTAL: hep A Total Ab: NEGATIVE

## 2022-07-26 LAB — HEPATITIS B E ANTIBODY: Hep B E Ab: NONREACTIVE

## 2022-07-26 LAB — IMMUNOGLOBULINS A/E/G/M, SERUM
IgG (Immunoglobin G), Serum: 1284 mg/dL (ref 603–1613)
IgM (Immunoglobulin M), Srm: 112 mg/dL (ref 20–172)

## 2022-07-26 LAB — CELIAC DISEASE AB SCREEN W/RFX

## 2022-07-26 LAB — HEPATITIS B E ANTIGEN: Hep B E Ag: NEGATIVE

## 2022-07-26 NOTE — Telephone Encounter (Signed)
-----   Message from Wyline Mood, MD sent at 07/26/2022  8:46 AM EDT ----- Needs hep A/B vaccine

## 2022-07-26 NOTE — Progress Notes (Signed)
Needs hep A/ B vaccine

## 2022-07-26 NOTE — Telephone Encounter (Signed)
Called patient but had to leave him a detailed message letting him know that Dr. Tobi Bastos had reviewed his labs and recommended for him to get Hepatitis A/B vaccine. I asked for him to return my call so we could schedule him appointments to receive the vaccines.

## 2022-07-27 LAB — IRON,TIBC AND FERRITIN PANEL
Iron Saturation: 20 % (ref 15–55)
Iron: 65 ug/dL (ref 38–169)
Total Iron Binding Capacity: 320 ug/dL (ref 250–450)

## 2022-07-27 LAB — ANTI-MICROSOMAL ANTIBODY LIVER / KIDNEY: LKM1 Ab: 1 Units (ref 0.0–20.0)

## 2022-07-27 LAB — CK: Total CK: 158 U/L (ref 41–331)

## 2022-07-27 LAB — HEPATITIS B CORE ANTIBODY, TOTAL: Hep B Core Total Ab: NEGATIVE

## 2022-07-28 LAB — ANA: Anti Nuclear Antibody (ANA): POSITIVE — AB

## 2022-07-28 LAB — CERULOPLASMIN: Ceruloplasmin: 19.7 mg/dL (ref 16.0–31.0)

## 2022-07-28 LAB — CELIAC DISEASE AB SCREEN W/RFX: Antigliadin Abs, IgA: 11 units (ref 0–19)

## 2022-07-28 LAB — HEPATITIS C ANTIBODY: Hep C Virus Ab: NONREACTIVE

## 2022-07-28 LAB — IRON,TIBC AND FERRITIN PANEL
Ferritin: 174 ng/mL (ref 30–400)
UIBC: 255 ug/dL (ref 111–343)

## 2022-07-28 LAB — HEPATITIS B SURFACE ANTIBODY,QUALITATIVE: Hep B Surface Ab, Qual: NONREACTIVE

## 2022-07-28 LAB — MITOCHONDRIAL/SMOOTH MUSCLE AB PNL
Mitochondrial Ab: <20 Units (ref 0.0–20.0)
Smooth Muscle Ab: 17 Units (ref 0–19)

## 2022-07-28 LAB — IMMUNOGLOBULINS A/E/G/M, SERUM: IgA/Immunoglobulin A, Serum: 657 mg/dL — ABNORMAL HIGH (ref 61–437)

## 2022-07-31 NOTE — Telephone Encounter (Signed)
Pt returned your call regarding the need for Hepatitis A/B vaccine. Pt stated he would like to thank it about and let you know at his follow up appointment.

## 2022-08-14 ENCOUNTER — Telehealth: Payer: Self-pay | Admitting: Family Medicine

## 2022-08-14 DIAGNOSIS — E119 Type 2 diabetes mellitus without complications: Secondary | ICD-10-CM

## 2022-08-14 NOTE — Telephone Encounter (Signed)
Pt called saying insurance wants a new script for a 90 day supple of the Junuvia.  They won't pay for a 30 day.  He has not taken the medication in three days.  [facility]  CB#    [phone]

## 2022-08-15 MED ORDER — SITAGLIPTIN PHOSPHATE 100 MG PO TABS
100.0000 mg | ORAL_TABLET | Freq: Every day | ORAL | 1 refills | Status: DC
Start: 2022-08-15 — End: 2023-02-12

## 2022-08-18 ENCOUNTER — Other Ambulatory Visit: Payer: Self-pay | Admitting: Family Medicine

## 2022-08-18 DIAGNOSIS — I10 Essential (primary) hypertension: Secondary | ICD-10-CM

## 2022-10-15 ENCOUNTER — Other Ambulatory Visit: Payer: Self-pay | Admitting: Family Medicine

## 2022-10-15 DIAGNOSIS — E119 Type 2 diabetes mellitus without complications: Secondary | ICD-10-CM

## 2022-10-17 NOTE — Telephone Encounter (Signed)
Requested Prescriptions  Pending Prescriptions Disp Refills   glipiZIDE (GLUCOTROL) 10 MG tablet [Pharmacy Med Name: GLIPIZIDE 10 MG TABLET] 60 tablet 2    Sig: TAKE 1 TABLET (10 MG TOTAL) BY MOUTH TWICE A DAY BEFORE A MEAL     Endocrinology:  Diabetes - Sulfonylureas Passed - 10/15/2022  1:34 AM      Passed - HBA1C is between 0 and 7.9 and within 180 days    Hemoglobin A1C  Date Value Ref Range Status  07/14/2022 7.0 (A) 4.0 - 5.6 % Final   Hgb A1c MFr Bld  Date Value Ref Range Status  12/10/2020 6.8 (H) 4.8 - 5.6 % Final    Comment:             Prediabetes: 5.7 - 6.4          Diabetes: >6.4          Glycemic control for adults with diabetes: <7.0          Passed - Cr in normal range and within 360 days    Creat  Date Value Ref Range Status  01/12/2017 0.83 0.70 - 1.33 mg/dL Final    Comment:    For patients >30 years of age, the reference limit for Creatinine is approximately 13% higher for people identified as African-American. .    Creatinine, Ser  Date Value Ref Range Status  01/18/2022 0.93 0.76 - 1.27 mg/dL Final         Passed - Valid encounter within last 6 months    Recent Outpatient Visits           3 months ago Type 2 diabetes mellitus without complication, without long-term current use of insulin (HCC)   [place] [facility] Malva Limes, MD   5 months ago Type 2 diabetes mellitus without complication, without long-term current use of insulin (HCC)   [place] [facility] Malva Limes, MD   9 months ago Type 2 diabetes mellitus without complication, without long-term current use of insulin (HCC)   [facility] Malva Limes, MD   1 year ago Type 2 diabetes mellitus without complication, without long-term current use of insulin (HCC)   [place] [facility] Malva Limes, MD   1 year ago Type 2 diabetes mellitus without complication, without long-term current use of  insulin (HCC)   [place] [facility] Malva Limes, MD       Future Appointments             In 1 month Fisher, Demetrios Isaacs, MD [facility]

## 2022-11-11 ENCOUNTER — Other Ambulatory Visit: Payer: Self-pay | Admitting: Family Medicine

## 2022-11-11 DIAGNOSIS — I10 Essential (primary) hypertension: Secondary | ICD-10-CM

## 2022-11-13 NOTE — Telephone Encounter (Signed)
Requested medication (s) are due for refill today:   Yes  Requested medication (s) are on the active medication list:   Yes  Future visit scheduled:   Yes 11/17/2022   Last ordered: 08/18/2022 #90, 0 refills  Unable to refill due to labs being due per protocol.   Requested Prescriptions  Pending Prescriptions Disp Refills   lisinopril (ZESTRIL) 10 MG tablet [Pharmacy Med Name: LISINOPRIL 10 MG TABLET] 90 tablet 0    Sig: TAKE 1 TABLET BY MOUTH EVERY DAY     Cardiovascular:  ACE Inhibitors Failed - 11/11/2022  1:02 AM      Failed - Cr in normal range and within 180 days    Creat  Date Value Ref Range Status  01/12/2017 0.83 0.70 - 1.33 mg/dL Final    Comment:    For patients >52 years of age, the reference limit for Creatinine is approximately 13% higher for people identified as African-American. .    Creatinine, Ser  Date Value Ref Range Status  01/18/2022 0.93 0.76 - 1.27 mg/dL Final         Failed - K in normal range and within 180 days    Potassium  Date Value Ref Range Status  01/18/2022 4.6 3.5 - 5.2 mmol/L Final         Passed - Patient is not pregnant      Passed - Last BP in normal range    BP Readings from Last 1 Encounters:  07/25/22 126/73         Passed - Valid encounter within last 6 months    Recent Outpatient Visits           4 months ago Type 2 diabetes mellitus without complication, without long-term current use of insulin (HCC)   [place] [facility] Malva Limes, MD   6 months ago Type 2 diabetes mellitus without complication, without long-term current use of insulin (HCC)   [place] [facility] Malva Limes, MD   10 months ago Type 2 diabetes mellitus without complication, without long-term current use of insulin (HCC)   [place] [facility] Malva Limes, MD   1 year ago Type 2 diabetes mellitus without complication, without long-term current use of insulin (HCC)   [place]  [facility] Malva Limes, MD   1 year ago Type 2 diabetes mellitus without complication, without long-term current use of insulin (HCC)   [place] [facility] Malva Limes, MD       Future Appointments             In 4 days Fisher, Demetrios Isaacs, MD [facility], [facility]

## 2022-11-14 ENCOUNTER — Other Ambulatory Visit: Payer: Self-pay | Admitting: Family Medicine

## 2022-11-14 DIAGNOSIS — E119 Type 2 diabetes mellitus without complications: Secondary | ICD-10-CM

## 2022-11-14 NOTE — Telephone Encounter (Signed)
Requested medications are due for refill today.  no  Requested medications are on the active medications list.  yes  Last refill. 10/17/2022 #60 2rf  Future visit scheduled.   yes  Notes to clinic.  Requesting a 90 day supply.    Requested Prescriptions  Pending Prescriptions Disp Refills   glipiZIDE (GLUCOTROL) 10 MG tablet [Pharmacy Med Name: GLIPIZIDE 10 MG TABLET] 180 tablet 1    Sig: TAKE 1 TABLET (10 MG TOTAL) BY MOUTH TWICE A DAY BEFORE A MEAL     Endocrinology:  Diabetes - Sulfonylureas Passed - 11/14/2022  4:31 AM      Passed - HBA1C is between 0 and 7.9 and within 180 days    Hemoglobin A1C  Date Value Ref Range Status  07/14/2022 7.0 (A) 4.0 - 5.6 % Final   Hgb A1c MFr Bld  Date Value Ref Range Status  12/10/2020 6.8 (H) 4.8 - 5.6 % Final    Comment:             Prediabetes: 5.7 - 6.4          Diabetes: >6.4          Glycemic control for adults with diabetes: <7.0          Passed - Cr in normal range and within 360 days    Creat  Date Value Ref Range Status  01/12/2017 0.83 0.70 - 1.33 mg/dL Final    Comment:    For patients >66 years of age, the reference limit for Creatinine is approximately 13% higher for people identified as African-American. .    Creatinine, Ser  Date Value Ref Range Status  01/18/2022 0.93 0.76 - 1.27 mg/dL Final         Passed - Valid encounter within last 6 months    Recent Outpatient Visits           4 months ago Type 2 diabetes mellitus without complication, without long-term current use of insulin (HCC)   [place] [facility] Malva Limes, MD   6 months ago Type 2 diabetes mellitus without complication, without long-term current use of insulin (HCC)   [place] [facility] Malva Limes, MD   10 months ago Type 2 diabetes mellitus without complication, without long-term current use of insulin (HCC)   [place] [facility] Malva Limes, MD   1 year ago Type 2  diabetes mellitus without complication, without long-term current use of insulin (HCC)   [place] [facility] Malva Limes, MD   1 year ago Type 2 diabetes mellitus without complication, without long-term current use of insulin (HCC)   [facility] Malva Limes, MD       Future Appointments             In 3 days Fisher, Demetrios Isaacs, MD [facility], [facility]

## 2022-11-17 ENCOUNTER — Encounter: Payer: Self-pay | Admitting: Family Medicine

## 2022-11-17 ENCOUNTER — Ambulatory Visit: Payer: BC Managed Care – PPO | Admitting: Family Medicine

## 2022-11-17 VITALS — BP 120/75 | HR 85 | Wt 180.9 lb

## 2022-11-17 DIAGNOSIS — E785 Hyperlipidemia, unspecified: Secondary | ICD-10-CM

## 2022-11-17 DIAGNOSIS — E1169 Type 2 diabetes mellitus with other specified complication: Secondary | ICD-10-CM | POA: Diagnosis not present

## 2022-11-17 DIAGNOSIS — R7401 Elevation of levels of liver transaminase levels: Secondary | ICD-10-CM | POA: Diagnosis not present

## 2022-11-17 DIAGNOSIS — Z7984 Long term (current) use of oral hypoglycemic drugs: Secondary | ICD-10-CM | POA: Diagnosis not present

## 2022-11-17 DIAGNOSIS — E119 Type 2 diabetes mellitus without complications: Secondary | ICD-10-CM

## 2022-11-17 LAB — POCT GLYCOSYLATED HEMOGLOBIN (HGB A1C): Hemoglobin A1C: 6.9 % — AB (ref 4.0–5.6)

## 2022-11-17 MED ORDER — SILDENAFIL CITRATE 50 MG PO TABS: 50.0000 mg | ORAL_TABLET | Freq: Every day | ORAL | 5 refills | Status: DC | PRN

## 2022-11-17 NOTE — Progress Notes (Unsigned)
Established patient visit   Patient: Alejandro Marquez   DOB: [birth year]   67 y.o. Male  MRN: [id number] Visit Date: 11/17/2022  Today's healthcare provider: Mila Merry, MD   Chief Complaint  Patient presents with   Medical Management of Chronic Issues    4 month follow-up   Hyperlipidemia   Hypertension   Subjective    Discussed the use of AI scribe software for clinical note transcription with the patient, who gave verbal consent to proceed.  History of Present Illness   The patient, with a history of diabetes, hypertension, and liver disease, presents for a routine check-up. He reports that his blood sugar control has been good, with a recent HbA1c of 6.9. He has been adhering to his prescribed medications, including Metformin, Glipizide, and Januvia, without any reported side effects. However, he expresses concern about the cost of Januvia, which has increased from $25 to $85 per bottle for a three-month supply.  The patient also reports a history of weight gain due to dietary habits, which previously led to fatty liver disease and abnormal liver function tests. He has since lost weight, which he attributes to his current medication regimen.  In addition to his diabetes management, the patient has been experiencing issues with sexual function. He expresses interest in trying Viagra, but is concerned about the cost as his insurance does not cover it. He has not taken this medication before.  The patient has been managing his hypertension, but does not provide specific details about his blood pressure readings or medication regimen during this visit. He has no new symptoms or complaints related to his hypertension or liver disease. He is due for a cholesterol check and kidney function test, which he plans to complete in the coming week.       Medications: Outpatient Medications Prior to Visit  Medication Sig   clobetasol ointment (TEMOVATE) 0.05 % Apply 1 Application  topically 2 (two) times daily.   glipiZIDE (GLUCOTROL) 10 MG tablet TAKE 1 TABLET (10 MG TOTAL) BY MOUTH TWICE A DAY BEFORE A MEAL   lisinopril (ZESTRIL) 10 MG tablet TAKE 1 TABLET BY MOUTH EVERY DAY   metFORMIN (GLUCOPHAGE) 1000 MG tablet TAKE 1 TABLET (1,000 MG TOTAL) BY MOUTH TWICE A DAY WITH FOOD   sitaGLIPtin (JANUVIA) 100 MG tablet Take 1 tablet (100 mg total) by mouth daily.   No facility-administered medications prior to visit.   Review of Systems  Constitutional:  Negative for appetite change, chills and fever.  Respiratory:  Negative for chest tightness, shortness of breath and wheezing.   Cardiovascular:  Negative for chest pain and palpitations.  Gastrointestinal:  Negative for abdominal pain, nausea and vomiting.   {Insert previous labs (optional):23779} {See past labs  Heme  Chem  Endocrine  Serology  Results Review (optional):1}   Objective    BP 120/75 (BP Location: Left Arm, Patient Position: Sitting, Cuff Size: Normal)   Pulse 85   Wt 180 lb 14.4 oz (82.1 kg)   SpO2 98%   BMI 25.23 kg/m {Insert last BP/Wt (optional):23777}{See vitals history (optional):1}  Physical Exam  General appearance: Well developed, well nourished male, cooperative and in no acute distress Head: Normocephalic, without obvious abnormality, atraumatic Respiratory: Respirations even and unlabored, normal respiratory rate Extremities: All extremities are intact.  Skin: Skin color, texture, turgor normal. No rashes seen  Psych: Appropriate mood and affect. Neurologic: Mental status: Alert, oriented to person, place, and time, thought content appropriate.  Results for orders placed or performed in visit on 11/17/22  POCT HgB A1C  Result Value Ref Range   Hemoglobin A1C 6.9 (A) 4.0 - 5.6 %    Assessment & Plan        Type 2 Diabetes Mellitus A1c 6.9, well controlled on current regimen of Metformin, Glipizide, and Januvia. Patient reports no side effects from  medications. -Continue current medications. -Refill Januvia prescription.  Hyperlipidemia Due for cholesterol check. -Order fasting lipid panel.  Renal Function Due for kidney function check. -Order renal function tests.  Erectile Dysfunction Patient reports difficulty with sexual function, interested in trying Viagra. -Prescribe Viagra, provide Good RX coupon for cost reduction at [employer] or [employer].  Follow-up Patient to return for lab work next week.    Return in about 4 months (around 03/19/2023) for Hypertension, Diabetes.      Mila Merry, MD  [facility] [phone] (phone) [phone] (fax)  [facility]

## 2022-11-17 NOTE — Patient Instructions (Signed)
.   Please review the attached list of medications and notify my office if there are any errors.   . Please bring all of your medications to every appointment so we can make sure that our medication list is the same as yours.   

## 2022-11-21 DIAGNOSIS — E1169 Type 2 diabetes mellitus with other specified complication: Secondary | ICD-10-CM | POA: Diagnosis not present

## 2022-11-21 DIAGNOSIS — E785 Hyperlipidemia, unspecified: Secondary | ICD-10-CM | POA: Diagnosis not present

## 2022-11-22 LAB — COMPREHENSIVE METABOLIC PANEL
ALT: 41 [IU]/L (ref 0–44)
AST: 33 [IU]/L (ref 0–40)
Albumin: 4.1 g/dL (ref 3.9–4.9)
Alkaline Phosphatase: 66 [IU]/L (ref 44–121)
BUN/Creatinine Ratio: 15 (ref 10–24)
BUN: 15 mg/dL (ref 8–27)
Bilirubin Total: 0.8 mg/dL (ref 0.0–1.2)
CO2: 24 mmol/L (ref 20–29)
Calcium: 9.2 mg/dL (ref 8.6–10.2)
Chloride: 102 mmol/L (ref 96–106)
Creatinine, Ser: 1.03 mg/dL (ref 0.76–1.27)
Globulin, Total: 3 g/dL (ref 1.5–4.5)
Glucose: 192 mg/dL — ABNORMAL HIGH (ref 70–99)
Potassium: 4.5 mmol/L (ref 3.5–5.2)
Sodium: 139 mmol/L (ref 134–144)
Total Protein: 7.1 g/dL (ref 6.0–8.5)
eGFR: 81 mL/min/{1.73_m2} (ref >59)

## 2022-11-22 LAB — CBC
Hematocrit: 42.9 % (ref 37.5–51.0)
Hemoglobin: 14.6 g/dL (ref 13.0–17.7)
MCH: 31.9 pg (ref 26.6–33.0)
MCHC: 34 g/dL (ref 31.5–35.7)
MCV: 94 fL (ref 79–97)
Platelets: 89 10*3/uL — CL (ref 150–450)
RBC: 4.57 x10E6/uL (ref 4.14–5.80)
RDW: 12.5 % (ref 11.6–15.4)
WBC: 4.9 10*3/uL (ref 3.4–10.8)

## 2022-11-22 LAB — LIPID PANEL
Chol/HDL Ratio: 2.1 {ratio} (ref 0.0–5.0)
Cholesterol, Total: 69 mg/dL — ABNORMAL LOW (ref 100–199)
HDL: 33 mg/dL — ABNORMAL LOW (ref >39)
LDL Chol Calc (NIH): 24 mg/dL (ref 0–99)
Triglycerides: 44 mg/dL (ref 0–149)
VLDL Cholesterol Cal: 12 mg/dL (ref 5–40)

## 2022-11-27 DIAGNOSIS — E119 Type 2 diabetes mellitus without complications: Secondary | ICD-10-CM | POA: Diagnosis not present

## 2022-11-27 DIAGNOSIS — H524 Presbyopia: Secondary | ICD-10-CM | POA: Diagnosis not present

## 2022-11-27 LAB — HM DIABETES EYE EXAM

## 2022-11-28 DIAGNOSIS — L853 Xerosis cutis: Secondary | ICD-10-CM | POA: Diagnosis not present

## 2023-01-10 DIAGNOSIS — L711 Rhinophyma: Secondary | ICD-10-CM | POA: Diagnosis not present

## 2023-01-10 DIAGNOSIS — L728 Other follicular cysts of the skin and subcutaneous tissue: Secondary | ICD-10-CM | POA: Diagnosis not present

## 2023-01-15 DIAGNOSIS — B9561 Methicillin susceptible Staphylococcus aureus infection as the cause of diseases classified elsewhere: Secondary | ICD-10-CM | POA: Diagnosis not present

## 2023-01-15 DIAGNOSIS — L728 Other follicular cysts of the skin and subcutaneous tissue: Secondary | ICD-10-CM | POA: Diagnosis not present

## 2023-01-18 IMAGING — US US EXTREM LOW VENOUS*R*
1 series · 14 of 24 positions shown · non-contrast
Comparison: None.

CLINICAL DATA: Right lower leg swelling

EXAM:
RIGHT LOWER EXTREMITY VENOUS DOPPLER ULTRASOUND
TECHNIQUE: Gray-scale sonography with compression, as well as color and duplex
ultrasound, were performed to evaluate the deep venous system(s)
from the level of the common femoral vein through the popliteal and
proximal calf veins.

[Series 1: us venous img lower uni right (dvt) · portal-venous · 40 acquisitions, 14 frames shown]
[im 1/40]
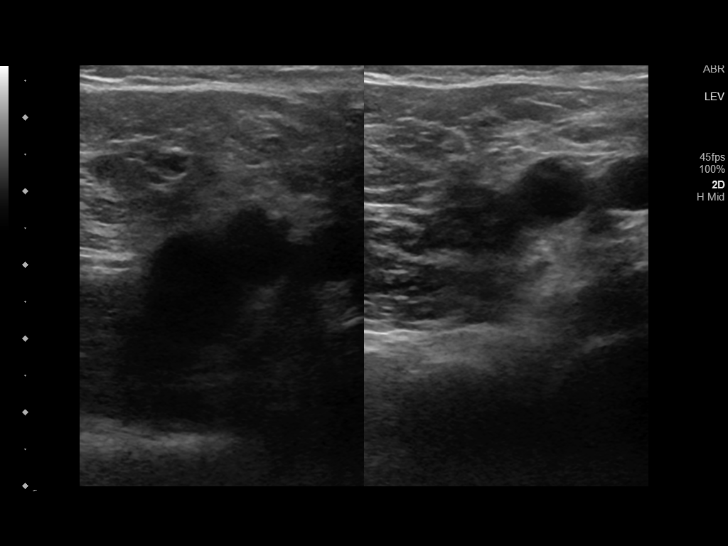
[im 4/40]
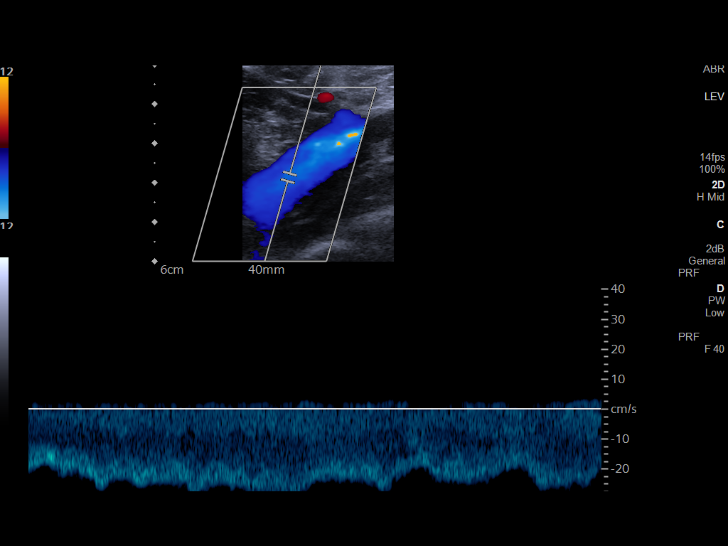
[im 7/40]
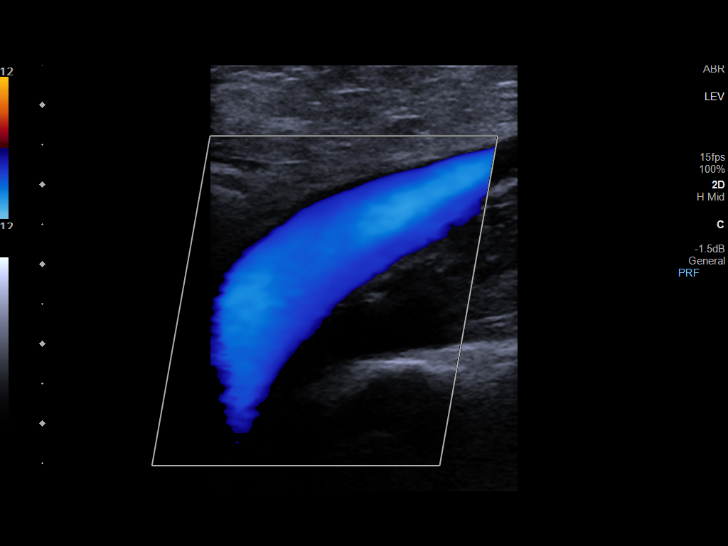
[im 11/40]
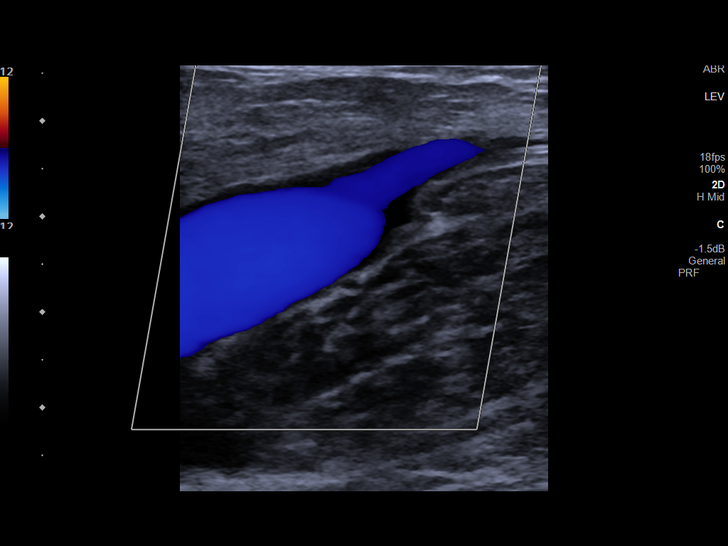
[im 12/40]
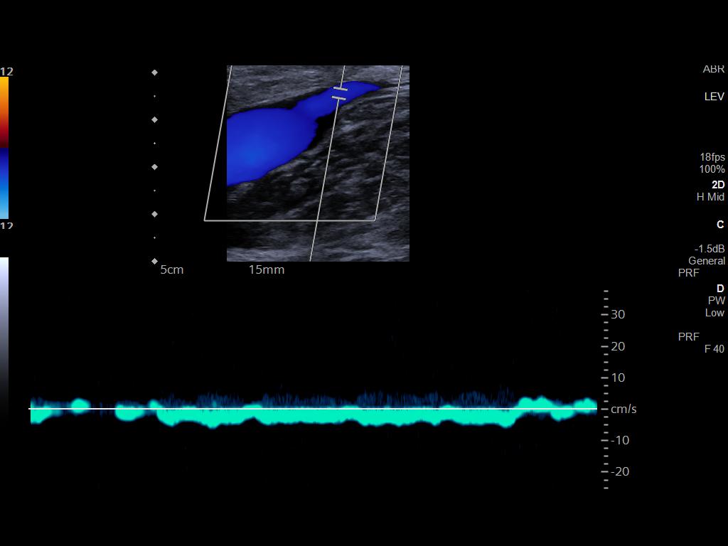
[im 16/40]
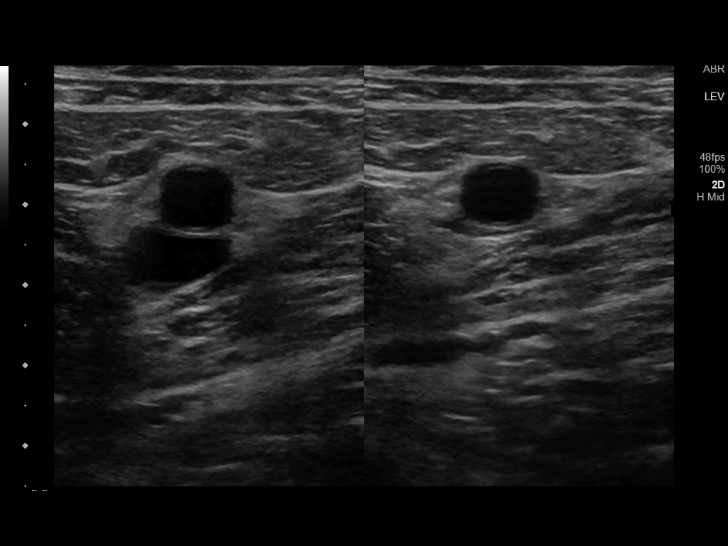
[im 19/40]
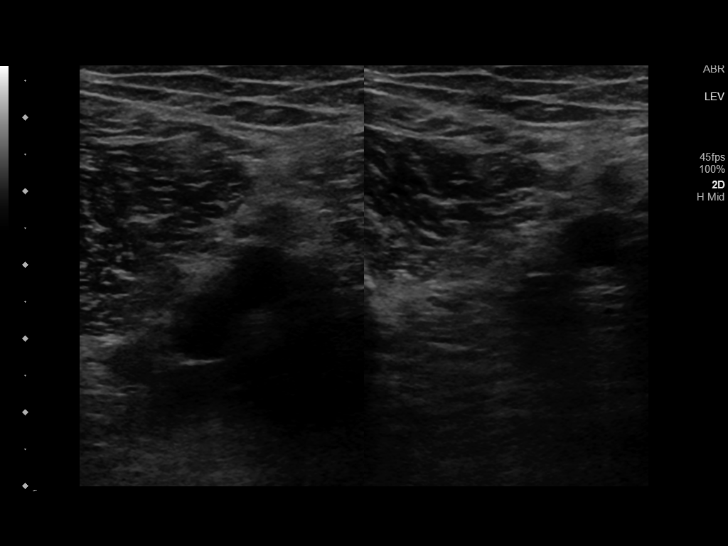
[im 23/40]
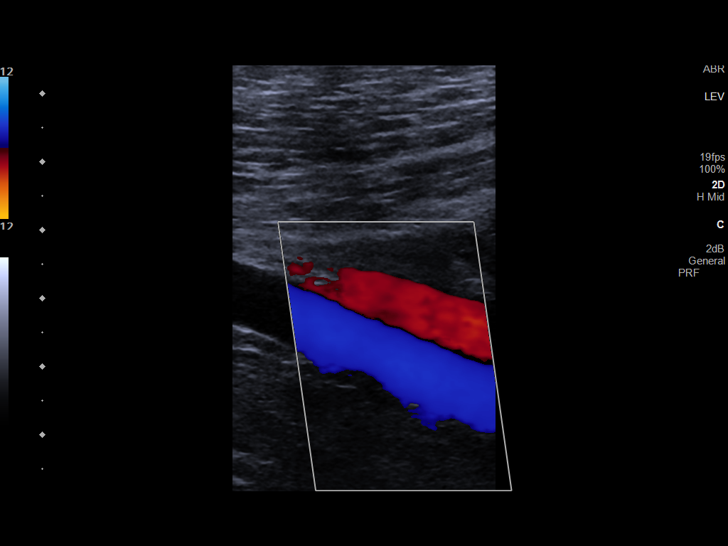
[im 26/40]
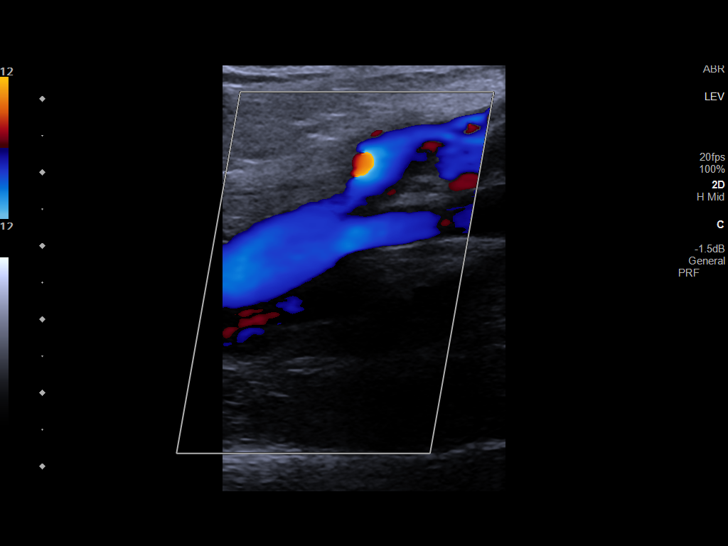
[im 29/40]
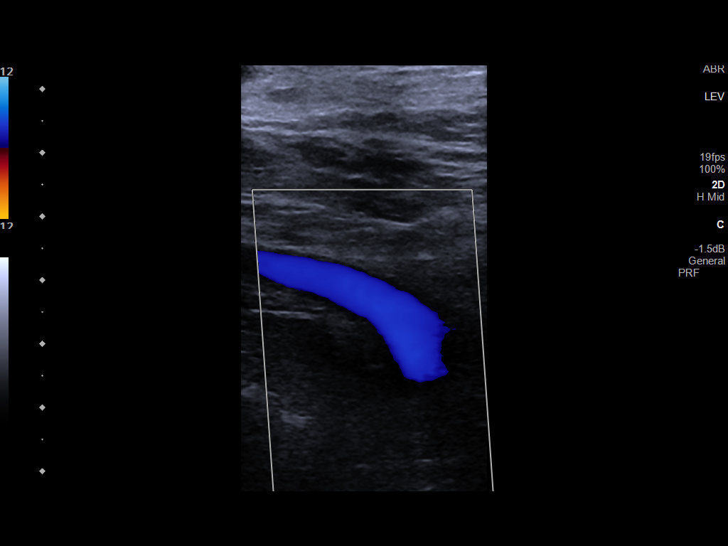
[im 33/40]
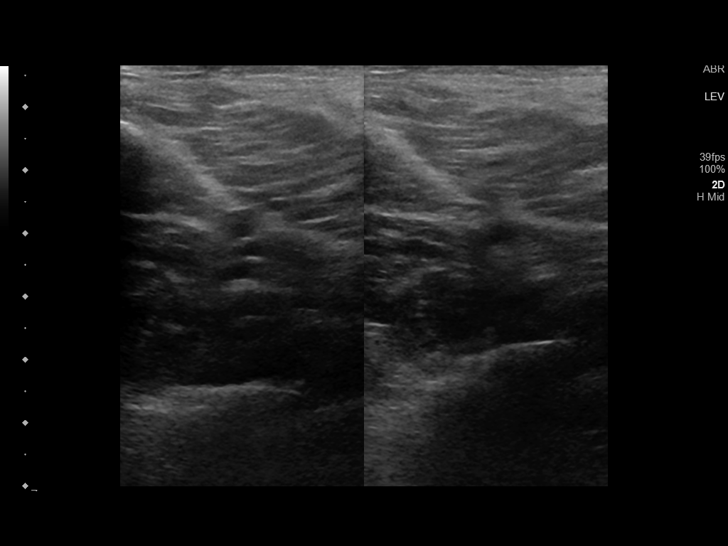
[im 34/40]
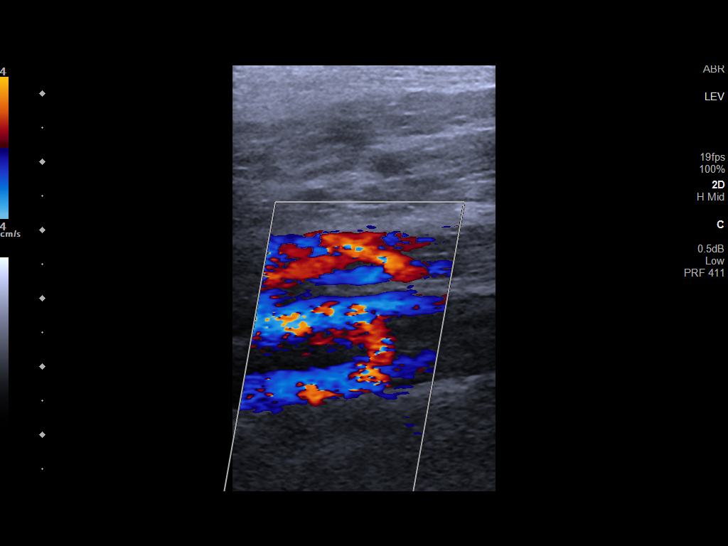
[im 38/40]
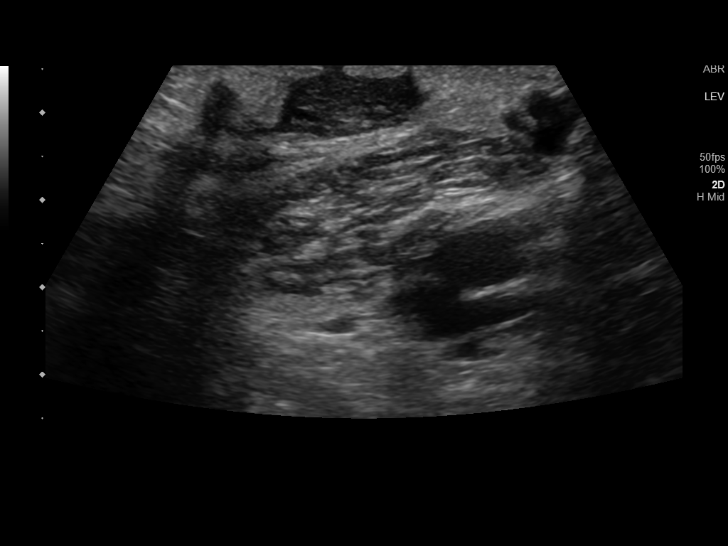
[im 40/40]
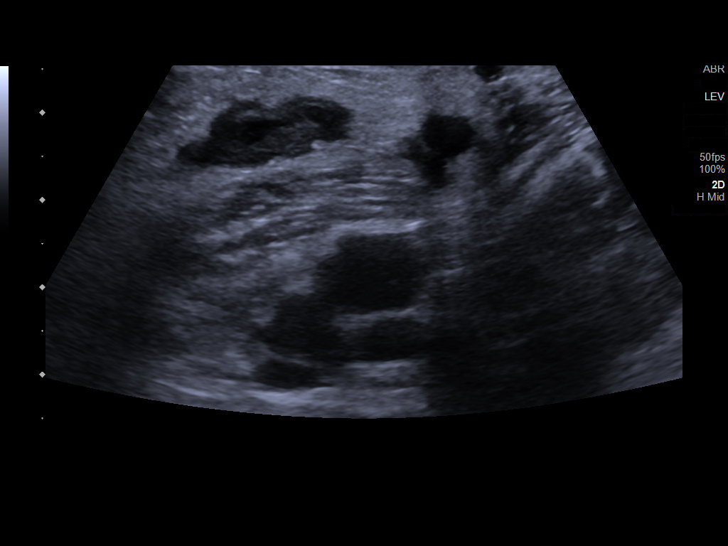

[14 of 24 positions shown; findings below may reference images not displayed]

FINDINGS: VENOUS

Normal compressibility of the common femoral, superficial femoral,
and popliteal veins, as well as the visualized calf veins.
Visualized portions of profunda femoral vein and great saphenous
vein unremarkable. No filling defects to suggest DVT on grayscale or
color Doppler imaging. Doppler waveforms show normal direction of
venous flow, normal respiratory plasticity and response to
augmentation.

Limited views of the contralateral common femoral vein are
unremarkable.

OTHER

Within the popliteal fossa, there is a hypoechoic area measuring 6 x
0.9 x 1.7 cm. No flow on color Doppler.

Limitations: none
IMPRESSION: No evidence of right lower extremity DVT.

Oblong hypoechoic lesion of the popliteal fossa may reflect a
complicated Baker cyst.

## 2023-02-08 ENCOUNTER — Other Ambulatory Visit: Payer: Self-pay | Admitting: Family Medicine

## 2023-02-08 DIAGNOSIS — I10 Essential (primary) hypertension: Secondary | ICD-10-CM

## 2023-02-08 NOTE — Telephone Encounter (Signed)
Requested Prescriptions  Pending Prescriptions Disp Refills   lisinopril (ZESTRIL) 10 MG tablet [Pharmacy Med Name: LISINOPRIL 10 MG TABLET] 90 tablet 0    Sig: TAKE 1 TABLET BY MOUTH EVERY DAY     Cardiovascular:  ACE Inhibitors Passed - 02/08/2023 10:16 AM      Passed - Cr in normal range and within 180 days    Creat  Date Value Ref Range Status  01/12/2017 0.83 0.70 - 1.33 mg/dL Final    Comment:    For patients >65 years of age, the reference limit for Creatinine is approximately 13% higher for people identified as African-American. .    Creatinine, Ser  Date Value Ref Range Status  11/21/2022 1.03 0.76 - 1.27 mg/dL Final         Passed - K in normal range and within 180 days    Potassium  Date Value Ref Range Status  11/21/2022 4.5 3.5 - 5.2 mmol/L Final         Passed - Patient is not pregnant      Passed - Last BP in normal range    BP Readings from Last 1 Encounters:  11/17/22 120/75         Passed - Valid encounter within last 6 months    Recent Outpatient Visits           2 months ago Type 2 diabetes mellitus without complication, without long-term current use of insulin (HCC)   [place] [facility] Malva Limes, MD   6 months ago Type 2 diabetes mellitus without complication, without long-term current use of insulin (HCC)   [place] [facility] Malva Limes, MD   9 months ago Type 2 diabetes mellitus without complication, without long-term current use of insulin (HCC)   [facility] Malva Limes, MD   1 year ago Type 2 diabetes mellitus without complication, without long-term current use of insulin (HCC)   [place] [facility] Malva Limes, MD   1 year ago Type 2 diabetes mellitus without complication, without long-term current use of insulin (HCC)   [place] [facility] Malva Limes, MD       Future Appointments             In 1  month Fisher, Demetrios Isaacs, MD [facility]

## 2023-02-10 ENCOUNTER — Other Ambulatory Visit: Payer: Self-pay | Admitting: Family Medicine

## 2023-02-10 DIAGNOSIS — E119 Type 2 diabetes mellitus without complications: Secondary | ICD-10-CM

## 2023-02-16 ENCOUNTER — Ambulatory Visit: Payer: Self-pay

## 2023-02-16 NOTE — Telephone Encounter (Signed)
Chief Complaint: Ankle swelling Symptoms: Right ankle swelling, redness from ankle down to the foot Frequency: constant Pertinent Negatives: Patient denies fever, pain, calf pain, difficulty breathing Disposition: [] ED /[] Urgent Care (no appt availability in office) / [x] Appointment(In office/virtual)/ []  [place] [facility]/ [] Home Care/ [] Refused Recommended Disposition /[] [facility]/ []  Follow-up with PCP Additional Notes: Patient states he noticed his right ankle has been swollen for a few days now. Patient denies injury or pain but does endorse redness to the right ankle along with the swelling. Patient reports he has type 2 diabetes. Patient states he has worked 2 jobs for the past 19 years that requires him to stand on his feet all day and things that may be the cause of the swelling. Patient states he has not had this type of swelling before. Patient was given care advice and has been scheduled at [facility] due to [facility] not having availability next week.    Summary: Swelling in right ankle   The patient states his right ankle is red and has been swelling. He noticed it only a few days ago starting. He does not remember injuring it. He does say it is warm to the touch. There are no appts soon with his provider available. Please assist patient further     Reason for Disposition  MILD or MODERATE ankle swelling (e.g., can't move joint normally, can't do usual activities) (Exceptions: Itchy, localized swelling; swelling is chronic.)  Answer Assessment - Initial Assessment Questions 1. LOCATION: "Which ankle is swollen?" "Where is the swelling?"     Right ankle  2. ONSET: "When did the swelling start?"     Few days ago  3. SWELLING: "How bad is the swelling?" Or, "How large is it?" (e.g., mild, moderate, severe; size of localized swelling)    - NONE: No joint swelling.   - LOCALIZED: Localized; small area of puffy or swollen skin (e.g., insect bite, skin irritation).   -  MILD: Joint looks or feels mildly swollen or puffy.   - MODERATE: Swollen; interferes with normal activities (e.g., work or school); decreased range of movement; may be limping.   - SEVERE: Very swollen; can't move swollen joint at all; limping a lot or unable to walk.     Mild to moderate  4. PAIN: "Is there any pain?" If Yes, ask: "How bad is it?" (Scale 1-10; or mild, moderate, severe)   - NONE (0): no pain.   - MILD (1-3): doesn't interfere with normal activities.    - MODERATE (4-7): interferes with normal activities (e.g., work or school) or awakens from sleep, limping.    - SEVERE (8-10): excruciating pain, unable to do any normal activities, unable to walk.      None 5. CAUSE: "What do you think caused the ankle swelling?"     Standing all day  6. OTHER SYMPTOMS: "Do you have any other symptoms?" (e.g., fever, chest pain, difficulty breathing, calf pain)     Warm to touch, red from the ankle to the foot  Protocols used: Ankle Swelling-A-AH

## 2023-02-16 NOTE — Telephone Encounter (Signed)
He needs to go to urgent care. Could be gout blood clot or infection, which should not wait to be be treated.

## 2023-02-19 ENCOUNTER — Other Ambulatory Visit: Payer: BC Managed Care – PPO

## 2023-02-19 ENCOUNTER — Encounter: Payer: Self-pay | Admitting: Physician Assistant

## 2023-02-19 ENCOUNTER — Ambulatory Visit: Payer: BC Managed Care – PPO | Admitting: Physician Assistant

## 2023-02-19 VITALS — BP 136/70 | HR 70 | Resp 16 | Ht 71.0 in | Wt 183.0 lb

## 2023-02-19 DIAGNOSIS — M7989 Other specified soft tissue disorders: Secondary | ICD-10-CM

## 2023-02-19 DIAGNOSIS — L03116 Cellulitis of left lower limb: Secondary | ICD-10-CM

## 2023-02-19 DIAGNOSIS — I83892 Varicose veins of left lower extremities with other complications: Secondary | ICD-10-CM | POA: Diagnosis not present

## 2023-02-19 DIAGNOSIS — R2240 Localized swelling, mass and lump, unspecified lower limb: Secondary | ICD-10-CM | POA: Diagnosis not present

## 2023-02-19 NOTE — Telephone Encounter (Signed)
Patient was seen by [facility] and is being scheduled for ultrasound

## 2023-02-19 NOTE — Progress Notes (Signed)
Acute Office Visit   Patient: Alejandro Marquez   DOB: [birth year]   67 y.o. Male  MRN: [id number] Visit Date: 02/19/2023  Today's healthcare provider: Oswaldo Conroy Caidence Higashi, PA-C  Introduced myself to the patient as a Secondary school teacher and provided education on APPs in clinical practice.    Chief Complaint  Patient presents with   Joint Swelling    Left ankle, red. No pain. Since Christmas   Subjective    HPI HPI     Joint Swelling    Additional comments: Left ankle, red. No pain. Since Christmas      Last edited by Dollene Primrose, CMA on 02/19/2023  8:15 AM.       He reports swelling to left ankle States he noticed this right after Christmas but denies pain- states he is on feet at 2 jobs and has not had any pain from this Reports he has tried wrapping with ACE bandage to help with swelling and this was painful Denies fluctuation in swelling and redness- states it stays constant He denies recent immobility or prolonged travel, active cancer tx or dx   Wells Criteria score of 3 - high likelihood    Medications: Outpatient Medications Prior to Visit  Medication Sig   clobetasol ointment (TEMOVATE) 0.05 % Apply 1 Application topically 2 (two) times daily.   glipiZIDE (GLUCOTROL) 10 MG tablet TAKE 1 TABLET (10 MG TOTAL) BY MOUTH TWICE A DAY BEFORE A MEAL   JANUVIA 100 MG tablet TAKE 1 TABLET BY MOUTH EVERY DAY   lisinopril (ZESTRIL) 10 MG tablet TAKE 1 TABLET BY MOUTH EVERY DAY   metFORMIN (GLUCOPHAGE) 1000 MG tablet TAKE 1 TABLET (1,000 MG TOTAL) BY MOUTH TWICE A DAY WITH FOOD   sildenafil (VIAGRA) 50 MG tablet Take 1 tablet (50 mg total) by mouth daily as needed for erectile dysfunction.   No facility-administered medications prior to visit.    Review of Systems  Respiratory:  Negative for cough, shortness of breath and wheezing.   Cardiovascular:  Positive for leg swelling. Negative for chest pain and palpitations.  Skin:  Positive for color change.        Objective     BP 136/70   Pulse 70   Resp 16   Ht 5\' 11"  (1.803 m)   Wt 183 lb (83 kg)   SpO2 99%   BMI 25.52 kg/m     Physical Exam Vitals reviewed.  Constitutional:      General: He is awake.     Appearance: Normal appearance. He is well-developed and well-groomed.  HENT:     Head: Normocephalic and atraumatic.  Cardiovascular:     Pulses:          Dorsalis pedis pulses are 2+ on the right side and 2+ on the left side.  Pulmonary:     Effort: Pulmonary effort is normal.     Breath sounds: Normal breath sounds.  Feet:     Left foot:     Skin integrity: Erythema and warmth present.     Toenail Condition: Left toenails are abnormally thick and long.     Comments: Redness and pitting edema of left lower leg- ankle and foot Pitting edema of 2+ present compared to 0 on right   Skin:    Findings: Erythema present.  Neurological:     Mental Status: He is alert.  Psychiatric:        Behavior: Behavior is cooperative.  No results found for any visits on 02/19/23.  Assessment & Plan      No follow-ups on file.        Problem List Items Addressed This Visit   None Visit Diagnoses       Left leg swelling    -  Primary   Relevant Orders   US Venous Img Lower Unilateral Left     Varicose veins of leg with swelling, left       Relevant Orders   US Venous Img Lower Unilateral Left     Localized swelling of lower leg       Relevant Orders   US Venous Img Lower Unilateral Left      Acute, new concern Patient reports pitting edema and redness to left lower leg/ankle and foot for the past 4-5 days  Area is nontender to palpation and DP pulses are 2+ bilaterally  Left lower leg is notably larger in circumference compared to right  Differential includes but not limited to: DVT, cellulitis, gout but given presentation I am more suspicious of DVT at this time Will send for STAT US of left lower extremity for DVT rule out  Results to dictate further management  Reviewed  ED and return precautions- reviewed signs of progressing DVT, PE and when to seek ED care/ call emergency services-patient voiced understanding and agreement Follow up as needed for persistent or progressing symptoms    No follow-ups on file.   I, Govani Radloff E Faithe Ariola, PA-C, have reviewed all documentation for this visit. The documentation on 02/19/23 for the exam, diagnosis, procedures, and orders are all accurate and complete.   Jacquelin Hawking, MHS, PA-C [facility]

## 2023-02-20 ENCOUNTER — Ambulatory Visit
Admission: RE | Admit: 2023-02-20 | Discharge: 2023-02-20 | Disposition: A | Discharge status: Home / Self Care | Payer: BC Managed Care – PPO | Source: Ambulatory Visit | Attending: Physician Assistant | Admitting: Physician Assistant

## 2023-02-20 DIAGNOSIS — R600 Localized edema: Secondary | ICD-10-CM | POA: Diagnosis not present

## 2023-02-20 DIAGNOSIS — R2240 Localized swelling, mass and lump, unspecified lower limb: Secondary | ICD-10-CM | POA: Insufficient documentation

## 2023-02-20 DIAGNOSIS — I83892 Varicose veins of left lower extremities with other complications: Secondary | ICD-10-CM | POA: Diagnosis not present

## 2023-02-20 DIAGNOSIS — M7989 Other specified soft tissue disorders: Secondary | ICD-10-CM | POA: Insufficient documentation

## 2023-02-20 MED ORDER — CEPHALEXIN 500 MG PO CAPS: 500.0000 mg | ORAL_CAPSULE | Freq: Four times a day (QID) | ORAL | 0 refills | Status: AC

## 2023-02-20 NOTE — Addendum Note (Signed)
Addended by: Jacquelin Hawking on: 02/20/2023 12:09 PM   Modules accepted: Orders

## 2023-02-20 NOTE — Progress Notes (Signed)
 Your ultrasound was negative for signs of a DVT which is good news. I am sending in a script for an antibiotic in case your swelling and redness is due to a skin infection called cellulitis. Please take this as directed with food and let us  know if you have further questions.

## 2023-03-16 ENCOUNTER — Ambulatory Visit: Payer: BC Managed Care – PPO | Admitting: Family Medicine

## 2023-03-16 VITALS — BP 123/77 | HR 83 | Temp 97.4°F | Resp 14 | Ht 71.0 in | Wt 186.9 lb

## 2023-03-16 DIAGNOSIS — I152 Hypertension secondary to endocrine disorders: Secondary | ICD-10-CM

## 2023-03-16 DIAGNOSIS — E119 Type 2 diabetes mellitus without complications: Secondary | ICD-10-CM

## 2023-03-16 DIAGNOSIS — E785 Hyperlipidemia, unspecified: Secondary | ICD-10-CM | POA: Diagnosis not present

## 2023-03-16 DIAGNOSIS — L03116 Cellulitis of left lower limb: Secondary | ICD-10-CM

## 2023-03-16 DIAGNOSIS — E1159 Type 2 diabetes mellitus with other circulatory complications: Secondary | ICD-10-CM

## 2023-03-16 DIAGNOSIS — E1169 Type 2 diabetes mellitus with other specified complication: Secondary | ICD-10-CM | POA: Diagnosis not present

## 2023-03-16 MED ORDER — CEFDINIR 300 MG PO CAPS: 600.0000 mg | ORAL_CAPSULE | Freq: Every day | ORAL | 0 refills | Status: AC

## 2023-03-16 NOTE — Patient Instructions (Signed)
Alejandro Marquez  Please review the attached list of medications and notify my office if there are any errors.   . Please bring all of your medications to every appointment so we can make sure that our medication list is the same as yours.

## 2023-03-17 LAB — HEMOGLOBIN A1C
Est. average glucose Bld gHb Est-mCnc: 183 mg/dL
Hgb A1c MFr Bld: 8 % — ABNORMAL HIGH (ref 4.8–5.6)

## 2023-03-17 LAB — PSA TOTAL (REFLEX TO FREE): Prostate Specific Ag, Serum: 2.5 ng/mL (ref 0.0–4.0)

## 2023-03-17 LAB — MICROALBUMIN / CREATININE URINE RATIO
Creatinine, Urine: 99.4 mg/dL
Microalb/Creat Ratio: 3 mg/g{creat} (ref 0–29)
Microalbumin, Urine: 3.1 ug/mL

## 2023-03-19 ENCOUNTER — Ambulatory Visit: Payer: Self-pay | Admitting: *Deleted

## 2023-03-19 NOTE — Telephone Encounter (Signed)
  Chief Complaint: Pt called in but line disconnected so I called him back and read him the lab result note from Dr. Sherrie Mustache dated 03/18/2023 at 8:33 AM. Symptoms: N/A Frequency: N/A Pertinent Negatives: Patient denies N/A Disposition: [] ED /[] Urgent Care (no appt availability in office) / [] Appointment(In office/virtual)/ []  [place] [facility]/ [] Home Care/ [] Refused Recommended Disposition /[] [place] [facility]/ [x]  Follow-up with PCP Additional Notes: He is to have a follow up visit with Dr. Sherrie Mustache in 4 mo.   He has an appt with Dr Sherrie Mustache on 03/30/2023 and mentioned he would make the f/u appt during that visit.   (I added a note regarding this to the appt note as a reminder he needs an appt for April).    His A1C was elevated so Dr. Sherrie Mustache wanted to follow back up in April.

## 2023-03-19 NOTE — Telephone Encounter (Signed)
Reason for Disposition  [1] Follow-up call to recent contact AND [2] information only call, no triage required  Answer Assessment - Initial Assessment Questions 1. REASON FOR CALL or QUESTION: "What is your reason for calling today?" or "How can I best help you?" or "What question do you have that I can help answer?"     Pt given lab results per notes of Dr. Sherrie Mustache on 03/18/2023 at 8:33 AM.   Pt verbalized understanding.  He wants to wait and schedule his 4 mo. Follow up during his OV on 03/30/2023.   I added a note regarding this to the appt. Note.  Protocols used: Information Only Call - No Triage-A-AH

## 2023-03-21 NOTE — Progress Notes (Signed)
Established patient visit   Patient: Alejandro Marquez   DOB: Nov 06, 1955   68 y.o. Male  MRN: [id number] Visit Date: 03/16/2023  Today's healthcare provider: Mila Merry, MD   Chief Complaint  Patient presents with   Follow-up    4 mo f/u for leg swelling and ultrasound done since last visit     Subjective    Discussed the use of AI scribe software for clinical note transcription with the patient, who gave verbal consent to proceed.  History of Present Illness   Alejandro Marquez, a patient with a history of diabetes and hypertension, presents for a routine follow-up. He reports feeling well and has not experienced any adverse effects from his current medications. He does not monitor his blood sugar or blood pressure at home, but believes his blood pressure is around 123. He has a family history of diabetes, but he manages his condition through diet and medication, rather than insulin.  He denies experiencing any chest pain, heart palpitations, or shortness of breath. His most recent eye examination was in September, and the results were reportedly normal. He has not experienced any numbness or tingling in his fingers or toes.  However, he has been dealing with a leg issue. He has venous ultrasound in December due to swelling in left foot, which was twice the size of the other. He was given fluid pills, which helped reduce the swelling. He denies any pain associated with the swelling. Despite improvement, his leg remains red and warm to touch, suggesting a possible ongoing infection. He was previously treated with doxycycline, but the infection has not completely resolved.       Medications: Outpatient Medications Prior to Visit  Medication Sig   clobetasol ointment (TEMOVATE) 0.05 % Apply 1 Application topically 2 (two) times daily.   glipiZIDE (GLUCOTROL) 10 MG tablet TAKE 1 TABLET (10 MG TOTAL) BY MOUTH TWICE A DAY BEFORE A MEAL   JANUVIA 100 MG tablet TAKE 1 TABLET BY MOUTH  EVERY DAY   lisinopril (ZESTRIL) 10 MG tablet TAKE 1 TABLET BY MOUTH EVERY DAY   metFORMIN (GLUCOPHAGE) 1000 MG tablet TAKE 1 TABLET (1,000 MG TOTAL) BY MOUTH TWICE A DAY WITH FOOD   sildenafil (VIAGRA) 50 MG tablet Take 1 tablet (50 mg total) by mouth daily as needed for erectile dysfunction.   No facility-administered medications prior to visit.   Review of Systems  Respiratory: Negative.  Negative for cough, shortness of breath and wheezing.   Cardiovascular:  Negative for chest pain, palpitations and leg swelling.  Neurological:  Negative for weakness and headaches.       Objective    BP 123/77 (BP Location: Left Arm, Patient Position: Sitting, Cuff Size: Large)   Pulse 83   Temp (!) 97.4 F (36.3 C)   Resp 14   Ht 5\' 11"  (1.803 m)   Wt 186 lb 14.4 oz (84.8 kg)   SpO2 97%   BMI 26.07 kg/m    Physical Exam   CHEST: Clear to auscultation. EXTREMITIES: Left lower extremity with erythema and warmth, edema present.       Assessment & Plan        Cellulitis Improvement noted but persistent erythema and warmth in the lower extremity. No systemic symptoms. -Start Cefdinir for 2 weeks. -Follow-up in 2 weeks to ensure complete resolution.  Diabetes Mellitus No home glucose monitoring. No symptoms of hyperglycemia or hypoglycemia reported. -Order A1c today.  Hypertension Well controlled. No symptoms of hypotension  or hypertension reported. -Continue current antihypertensive regimen.  General Health Maintenance -Order PSA today. -Order urine ACR -Annual eye exam completed in October with no diabetic retinopathy noted.    Return in about 2 weeks (around 03/30/2023) for cellulitis.      Mila Merry, MD  [facility] [phone] (phone) [phone] (fax)  [facility]

## 2023-03-30 ENCOUNTER — Encounter: Payer: Self-pay | Admitting: Family Medicine

## 2023-03-30 ENCOUNTER — Ambulatory Visit: Payer: BC Managed Care – PPO | Admitting: Family Medicine

## 2023-03-30 VITALS — BP 124/65 | HR 77 | Resp 16 | Wt 187.3 lb

## 2023-03-30 DIAGNOSIS — L03116 Cellulitis of left lower limb: Secondary | ICD-10-CM

## 2023-03-30 NOTE — Patient Instructions (Signed)
 Alejandro Marquez  Please review the attached list of medications and notify my office if there are any errors.   . Please bring all of your medications to every appointment so we can make sure that our medication list is the same as yours.

## 2023-03-30 NOTE — Progress Notes (Signed)
      Established patient visit   Patient: Alejandro Marquez   DOB: 1955/03/07   68 y.o. Male  MRN: [id number] Visit Date: 03/30/2023  Today's healthcare provider: Nancyann Perry, MD   Chief Complaint  Patient presents with   Cellulitis   Subjective    HPI  Follow up leg swelling and cellulitis. Prescribed 14 days cefdinir  on 1/24. Swelling has resolved. Not having any pain.   Medications: Outpatient Medications Prior to Visit  Medication Sig   clobetasol  ointment (TEMOVATE ) 0.05 % Apply 1 Application topically 2 (two) times daily.   glipiZIDE  (GLUCOTROL ) 10 MG tablet TAKE 1 TABLET (10 MG TOTAL) BY MOUTH TWICE A DAY BEFORE A MEAL   JANUVIA  100 MG tablet TAKE 1 TABLET BY MOUTH EVERY DAY   lisinopril  (ZESTRIL ) 10 MG tablet TAKE 1 TABLET BY MOUTH EVERY DAY   metFORMIN  (GLUCOPHAGE ) 1000 MG tablet TAKE 1 TABLET (1,000 MG TOTAL) BY MOUTH TWICE A DAY WITH FOOD   sildenafil  (VIAGRA ) 50 MG tablet Take 1 tablet (50 mg total) by mouth daily as needed for erectile dysfunction.   cefdinir  (OMNICEF ) 300 MG capsule Take 2 capsules (600 mg total) by mouth daily for 14 days.   No facility-administered medications prior to visit.    Review of Systems     Objective    BP 124/65 (BP Location: Left Arm, Patient Position: Sitting, Cuff Size: Normal)   Pulse 77   Resp 16   Wt 187 lb 4.8 oz (85 kg)   SpO2 97%   BMI 26.12 kg/m    Physical Exam  Faint dull redness both lower legs which he reports as being chronic and at baseline. Not warm to touch. Trace bilateral lower leg edema.   Assessment & Plan     1. Cellulitis of left lower extremity (Primary) No sign of infection since completing antibiotic. He is to keep legs elevated when not ambulating to keep edema controlled. Call if symptoms return    Return in about 3 months (around 06/27/2023). For diabetes.       Nancyann Perry, MD  [facility] [phone] (phone) [phone] (fax)  [facility]

## 2023-04-11 ENCOUNTER — Ambulatory Visit: Payer: Self-pay | Admitting: Family Medicine

## 2023-04-11 DIAGNOSIS — L03116 Cellulitis of left lower limb: Secondary | ICD-10-CM

## 2023-04-11 MED ORDER — CEFDINIR 300 MG PO CAPS: 600.0000 mg | ORAL_CAPSULE | Freq: Every day | ORAL | 0 refills | Status: AC

## 2023-04-11 NOTE — Telephone Encounter (Signed)
 Patient advised.

## 2023-04-11 NOTE — Addendum Note (Signed)
 Addended by: Malva Limes on: 04/11/2023 11:22 AM   Modules accepted: Orders

## 2023-04-11 NOTE — Telephone Encounter (Signed)
 Chief Complaint: Right leg swelling Symptoms: swelling, redness, warmth Frequency: Since last Friday, worsening Pertinent Negatives: Patient denies fever, SOB, CP Disposition: [] ED /[x] Urgent Care (no appt availability in office) / [] Appointment(In office/virtual)/ []  [place] [facility]/ [] Home Care/ [x] Refused Recommended Disposition /[] [place] [facility]/ []  Follow-up with PCP Additional Notes: Previously treated for left foot/leg cellulitis. Pt reports he is now experiencing swelling, redness, and warmth to the right leg/foot. Pt notes pitting edema, denies fever, SOB, CP. Advised appt today, pt declines different office and UC. Scheduled OV tomorrow PM, encouraged pt to consider being seen today due to risk of DVT. This RN educated pt on home care, new-worsening symptoms, when to call back/seek emergent care. Pt verbalized understanding and agrees to plan.    Copied from CRM [id number]. Topic: Clinical - Red Word Triage >> Apr 11, 2023 10:40 AM Elle L wrote: Red Word that prompted transfer to Nurse Triage: The patient is having swelling in his left foot from cellulitis. Reason for Disposition  SEVERE leg swelling (e.g., swelling extends above knee, entire leg is swollen, weeping fluid)  Answer Assessment - Initial Assessment Questions 1. ONSET: "When did the swelling start?" (e.g., minutes, hours, days)     Friday of last week 2. LOCATION: "What part of the leg is swollen?"  "Are both legs swollen or just one leg?"     Right leg  3. SEVERITY: "How bad is the swelling?" (e.g., localized; mild, moderate, severe)   - Localized: Small area of swelling localized to one leg.   - MILD pedal edema: Swelling limited to foot and ankle, pitting edema < 1/4 inch (6 mm) deep, rest and elevation eliminate most or all swelling.   - MODERATE edema: Swelling of lower leg to knee, pitting edema > 1/4 inch (6 mm) deep, rest and elevation only partially reduce swelling.   - SEVERE edema: Swelling  extends above knee, facial or hand swelling present.      Pitting edema 4. REDNESS: "Does the swelling look red or infected?"     Red and warm 5. PAIN: "Is the swelling painful to touch?" If Yes, ask: "How painful is it?"   (Scale 1-10; mild, moderate or severe)     Painful if it's "bumped on something" 6. FEVER: "Do you have a fever?" If Yes, ask: "What is it, how was it measured, and when did it start?"      None 7. CAUSE: "What do you think is causing the leg swelling?"     Possible cellulitis  9. RECURRENT SYMPTOM: "Have you had leg swelling before?" If Yes, ask: "When was the last time?" "What happened that time?"     Yes, left leg cellulitis recently 10. OTHER SYMPTOMS: "Do you have any other symptoms?" (e.g., chest pain, difficulty breathing)       None  Protocols used: Leg Swelling and Edema-A-AH

## 2023-04-11 NOTE — Telephone Encounter (Signed)
 Not sure if we will be open tomorrow. Have sent prescription for antibiotic he can go ahead and start on. Keep appointment tomorrow if we are open.

## 2023-04-12 ENCOUNTER — Ambulatory Visit: Payer: BC Managed Care – PPO | Admitting: Family Medicine

## 2023-04-13 ENCOUNTER — Encounter: Payer: Self-pay | Admitting: Physician Assistant

## 2023-04-13 ENCOUNTER — Ambulatory Visit: Payer: BC Managed Care – PPO | Admitting: Physician Assistant

## 2023-04-13 VITALS — BP 124/75 | HR 78 | Ht 71.0 in | Wt 188.0 lb

## 2023-04-13 DIAGNOSIS — M7989 Other specified soft tissue disorders: Secondary | ICD-10-CM | POA: Diagnosis not present

## 2023-04-13 DIAGNOSIS — L03116 Cellulitis of left lower limb: Secondary | ICD-10-CM | POA: Diagnosis not present

## 2023-04-13 DIAGNOSIS — I872 Venous insufficiency (chronic) (peripheral): Secondary | ICD-10-CM

## 2023-04-13 NOTE — Progress Notes (Signed)
 Established patient visit  Patient: Alejandro Marquez   DOB: 05/18/1955   68 y.o. Male  MRN: [id number] Visit Date: 04/13/2023  Today's healthcare provider: Debera Lat, PA-C   Chief Complaint  Patient presents with   Cellulitis    He has a flare on his lower L leg for about a week, he has been treated a few weeks ago by Dr Sherrie Mustache     Subjective      Discussed the use of AI scribe software for clinical note transcription with the patient, who gave verbal consent to proceed.  History of Present Illness   The patient, with a history of hypertension and diabetes, presents with recurrent cellulitis of the lower extremity. He reports a recent episode in December, which resolved with antibiotic therapy. The current episode is characterized by a non-painful, erythematous, and swollen area on the leg, which the patient admits to scratching. He denies any associated symptoms such as fever or malaise. The patient also reports a history of leg swelling, which was significantly worse during the previous cellulitis episode. He denies any recent changes in his chronic conditions, specifically hypertension and diabetes.           03/16/2023    3:50 PM 07/14/2022    4:00 PM 08/26/2021    4:34 PM  Depression screen PHQ 2/9  Decreased Interest 0 0 0  Down, Depressed, Hopeless 0 0 0  PHQ - 2 Score 0 0 0  Altered sleeping 0 0 0  Tired, decreased energy 0 0 0  Change in appetite 0 0 0  Feeling bad or failure about yourself  0 0 0  Trouble concentrating 0 0 0  Moving slowly or fidgety/restless 0 0 0  Suicidal thoughts 0 0 0  PHQ-9 Score 0 0 0  Difficult doing work/chores Not difficult at all Not difficult at all Not difficult at all       No data to display          Medications: Outpatient Medications Prior to Visit  Medication Sig   cefdinir (OMNICEF) 300 MG capsule Take 2 capsules (600 mg total) by mouth daily for 10 days.   clobetasol ointment (TEMOVATE) 0.05 % Apply 1 Application  topically 2 (two) times daily.   glipiZIDE (GLUCOTROL) 10 MG tablet TAKE 1 TABLET (10 MG TOTAL) BY MOUTH TWICE A DAY BEFORE A MEAL   JANUVIA 100 MG tablet TAKE 1 TABLET BY MOUTH EVERY DAY   lisinopril (ZESTRIL) 10 MG tablet TAKE 1 TABLET BY MOUTH EVERY DAY   metFORMIN (GLUCOPHAGE) 1000 MG tablet TAKE 1 TABLET (1,000 MG TOTAL) BY MOUTH TWICE A DAY WITH FOOD   sildenafil (VIAGRA) 50 MG tablet Take 1 tablet (50 mg total) by mouth daily as needed for erectile dysfunction. (Patient not taking: Reported on 04/13/2023)   No facility-administered medications prior to visit.    Review of Systems All negative Except see HPI       Objective    BP 124/75 (BP Location: Right Arm, Patient Position: Sitting, Cuff Size: Large)   Pulse 78   Ht 5\' 11"  (1.803 m)   Wt 188 lb (85.3 kg)   SpO2 100%   BMI 26.22 kg/m     Physical Exam Constitutional:      General: He is not in acute distress.    Appearance: Normal appearance. He is not diaphoretic.  HENT:     Head: Normocephalic.  Eyes:     Conjunctiva/sclera: Conjunctivae normal.  Pulmonary:  Effort: Pulmonary effort is normal. No respiratory distress.  Musculoskeletal:        General: Tenderness (r lower leg) present.  Skin:    Findings: Erythema and rash present.  Neurological:     Mental Status: He is alert and oriented to person, place, and time. Mental status is at baseline.      No results found for any visits on 04/13/23.      Assessment and Plan   Right leg swelling Venous insufficiency - US Venous Img Lower Unilateral Right (DVT); Future Cellulitis of left lower extremity (Primary) - US Venous Img Lower Unilateral Right (DVT); Future   Cellulitis Recurrent cellulitis in the lower extremity, currently on antibiotics (unknown name) for 10 days. No pain unless direct pressure applied. History of similar episode in December which resolved with antibiotics. -Continue current antibiotic course for 10 days. -Order lower  extremity ultrasound to rule out DVT. -Advise patient to wash area with antibacterial soap and avoid scratching. -Schedule follow-up appointment with Dr. Sherrie Mustache at the end of the antibiotic course if symptoms persist  Hypertension Chronic and stable Controlled on Lisinopril 10mg . -Continue current medication regimen. -Schedule follow-up appointment with Dr. Sherrie Mustache for routine hypertension management.  Chronic leg swelling Likely secondary to venous insufficiency.  Chronic Symptomatic Advised conservative management, mechanical treatment Pt was educated that Compression therapy is the standard of care for venous ulcers and chronic venous insufficiency and should be started as early as possible  Leg elevation above the level of the heart 30 minutes, 3 to 4 times a day. Inelastic compression therapy: provides pressure during ambulation and muscle contraction but no resting pressure; most common: Unna boot (zinc oxide moist bandage that hardens after application) Elastic compression therapy: sustains compression during rest and activity. Compression stockings: Pressure should be at least 20 to 30 mm Hg and preferably 30 to 40 mm Hg. They should be removed at night and should be replaced every 6 months. Elastic bandages (i.e., Profore) are alternatives to compression stockings. Advised to Exercise (e.g., activation of calf muscle pump with ankle flexion and extension).  -Recommend follow-up with Dr. Sherrie Mustache for further evaluation and potential management of chronic leg swelling.      Orders Placed This Encounter  Procedures   US Venous Img Lower Unilateral Right (DVT)    Standing Status:   Future    Expiration Date:   04/12/2024    Reason for Exam (SYMPTOM  OR DIAGNOSIS REQUIRED):   leg swelling and redness, painful on touch    Preferred imaging location?:   [facility]    No follow-ups on file.   The patient was advised to call back or seek an in-person evaluation if the  symptoms worsen or if the condition fails to improve as anticipated.  I discussed the assessment and treatment plan with the patient. The patient was provided an opportunity to ask questions and all were answered. The patient agreed with the plan and demonstrated an understanding of the instructions.  I, Debera Lat, PA-C have reviewed all documentation for this visit. The documentation on 04/13/2023  for the exam, diagnosis, procedures, and orders are all accurate and complete.  Debera Lat, [facility], MMS [facility] [phone] (phone) [phone] (fax)  [facility]

## 2023-04-16 ENCOUNTER — Ambulatory Visit: Payer: BC Managed Care – PPO | Admitting: Family Medicine

## 2023-04-19 ENCOUNTER — Other Ambulatory Visit: Payer: Self-pay | Admitting: Physician Assistant

## 2023-04-19 ENCOUNTER — Telehealth: Payer: Self-pay

## 2023-04-19 ENCOUNTER — Ambulatory Visit: Payer: Self-pay | Admitting: Family Medicine

## 2023-04-19 ENCOUNTER — Ambulatory Visit
Admission: RE | Admit: 2023-04-19 | Discharge: 2023-04-19 | Disposition: A | Discharge status: Home / Self Care | Payer: BC Managed Care – PPO | Source: Ambulatory Visit | Attending: Physician Assistant | Admitting: Physician Assistant

## 2023-04-19 DIAGNOSIS — L03116 Cellulitis of left lower limb: Secondary | ICD-10-CM | POA: Insufficient documentation

## 2023-04-19 DIAGNOSIS — M7989 Other specified soft tissue disorders: Secondary | ICD-10-CM | POA: Insufficient documentation

## 2023-04-19 DIAGNOSIS — I82431 Acute embolism and thrombosis of right popliteal vein: Secondary | ICD-10-CM | POA: Diagnosis not present

## 2023-04-19 DIAGNOSIS — M79604 Pain in right leg: Secondary | ICD-10-CM | POA: Diagnosis not present

## 2023-04-19 HISTORY — DX: Acute embolism and thrombosis of right popliteal vein: I82.431

## 2023-04-19 MED ORDER — APIXABAN (ELIQUIS) VTE STARTER PACK (10MG AND 5MG): ORAL_TABLET | ORAL | 0 refills | Status: DC

## 2023-04-19 NOTE — Telephone Encounter (Signed)
 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

## 2023-04-19 NOTE — Telephone Encounter (Signed)
 Stephanie from the ultrasound department with [facility] called to make sure this office received the patient's ultrasound results. This RN confirmed the results were received. Judeth Cornfield wanted to make sure since patient is positive for a DVT. Office called and states they are aware of results.  Copied from CRM [id number]. Topic: Clinical - Lab/Test Results >> Apr 19, 2023  4:31 PM Priscille Loveless wrote: Reason for CRM:di calling with stat results Reason for Disposition  General information question, no triage required and triager able to answer question  Answer Assessment - Initial Assessment Questions 1. REASON FOR CALL or QUESTION: "What is your reason for calling today?" or "How can I best help you?" or "What question do you have that I can help answer?"     Ultrasound results received by office  Protocols used: Information Only Call - No Triage-A-AH

## 2023-04-19 NOTE — Telephone Encounter (Signed)
 Eliquis starter pack sent to patient's pharmacy, stat referral to dvt clinic

## 2023-04-19 NOTE — Telephone Encounter (Signed)
 Patient advised as directed below and verbalized understanding.

## 2023-04-20 ENCOUNTER — Other Ambulatory Visit: Payer: Self-pay | Admitting: Physician Assistant

## 2023-04-20 DIAGNOSIS — I82431 Acute embolism and thrombosis of right popliteal vein: Secondary | ICD-10-CM

## 2023-04-20 NOTE — Progress Notes (Signed)
 Spoke with pt about [place] results, Dr Roxan Hockey placed apixaban Rx and referral to DVT

## 2023-04-20 NOTE — Telephone Encounter (Signed)
 done

## 2023-04-25 ENCOUNTER — Ambulatory Visit: Payer: BC Managed Care – PPO | Admitting: Physician Assistant

## 2023-04-25 ENCOUNTER — Encounter: Payer: Self-pay | Admitting: Physician Assistant

## 2023-04-25 VITALS — BP 133/75 | HR 77 | Ht 71.0 in | Wt 187.1 lb

## 2023-04-25 DIAGNOSIS — L97519 Non-pressure chronic ulcer of other part of right foot with unspecified severity: Secondary | ICD-10-CM

## 2023-04-25 DIAGNOSIS — I82431 Acute embolism and thrombosis of right popliteal vein: Secondary | ICD-10-CM

## 2023-04-25 DIAGNOSIS — E1169 Type 2 diabetes mellitus with other specified complication: Secondary | ICD-10-CM

## 2023-04-25 DIAGNOSIS — Z23 Encounter for immunization: Secondary | ICD-10-CM | POA: Diagnosis not present

## 2023-04-25 DIAGNOSIS — L03116 Cellulitis of left lower limb: Secondary | ICD-10-CM

## 2023-04-25 DIAGNOSIS — M799 Soft tissue disorder, unspecified: Secondary | ICD-10-CM

## 2023-04-25 DIAGNOSIS — E119 Type 2 diabetes mellitus without complications: Secondary | ICD-10-CM

## 2023-04-25 NOTE — Progress Notes (Unsigned)
 Established patient visit  Patient: Alejandro Marquez   DOB: 1955-05-18   68 y.o. Male  MRN: [id number] Visit Date: 04/25/2023  Today's healthcare provider: Debera Lat, PA-C   No chief complaint on file.  Subjective       Discussed the use of AI scribe software for clinical note transcription with the patient, who gave verbal consent to proceed.  History of Present Illness               03/16/2023    3:50 PM 07/14/2022    4:00 PM 08/26/2021    4:34 PM  Depression screen PHQ 2/9  Decreased Interest 0 0 0  Down, Depressed, Hopeless 0 0 0  PHQ - 2 Score 0 0 0  Altered sleeping 0 0 0  Tired, decreased energy 0 0 0  Change in appetite 0 0 0  Feeling bad or failure about yourself  0 0 0  Trouble concentrating 0 0 0  Moving slowly or fidgety/restless 0 0 0  Suicidal thoughts 0 0 0  PHQ-9 Score 0 0 0  Difficult doing work/chores Not difficult at all Not difficult at all Not difficult at all       No data to display          Medications: Outpatient Medications Prior to Visit  Medication Sig   APIXABAN (ELIQUIS) VTE STARTER PACK (10MG  AND 5MG ) Take as directed on package: start with two-5mg  tablets twice daily for 7 days. On day 8, switch to one-5mg  tablet twice daily.   [EXPIRED] cefdinir (OMNICEF) 300 MG capsule Take 2 capsules (600 mg total) by mouth daily for 10 days.   clobetasol ointment (TEMOVATE) 0.05 % Apply 1 Application topically 2 (two) times daily.   glipiZIDE (GLUCOTROL) 10 MG tablet TAKE 1 TABLET (10 MG TOTAL) BY MOUTH TWICE A DAY BEFORE A MEAL   JANUVIA 100 MG tablet TAKE 1 TABLET BY MOUTH EVERY DAY   lisinopril (ZESTRIL) 10 MG tablet TAKE 1 TABLET BY MOUTH EVERY DAY   metFORMIN (GLUCOPHAGE) 1000 MG tablet TAKE 1 TABLET (1,000 MG TOTAL) BY MOUTH TWICE A DAY WITH FOOD   sildenafil (VIAGRA) 50 MG tablet Take 1 tablet (50 mg total) by mouth daily as needed for erectile dysfunction. (Patient not taking: Reported on 04/13/2023)   No facility-administered  medications prior to visit.    Review of Systems  All other systems reviewed and are negative.  All negative Except see HPI   {Insert previous labs (optional):23779} {See past labs  Heme  Chem  Endocrine  Serology  Results Review (optional):1}   Objective    There were no vitals taken for this visit. {Insert last BP/Wt (optional):23777}{See vitals history (optional):1}   Physical Exam Vitals reviewed.  Constitutional:      General: He is not in acute distress.    Appearance: Normal appearance. He is not diaphoretic.  HENT:     Head: Normocephalic and atraumatic.  Eyes:     General: No scleral icterus.    Conjunctiva/sclera: Conjunctivae normal.  Cardiovascular:     Rate and Rhythm: Normal rate and regular rhythm.     Pulses: Normal pulses.     Heart sounds: Normal heart sounds. No murmur heard. Pulmonary:     Effort: Pulmonary effort is normal. No respiratory distress.     Breath sounds: Normal breath sounds. No wheezing or rhonchi.  Musculoskeletal:     Cervical back: Neck supple.     Right lower leg: No edema.  Left lower leg: No edema.  Lymphadenopathy:     Cervical: No cervical adenopathy.  Skin:    General: Skin is warm and dry.     Findings: No rash.  Neurological:     Mental Status: He is alert and oriented to person, place, and time. Mental status is at baseline.  Psychiatric:        Mood and Affect: Mood normal.        Behavior: Behavior normal.      No results found for any visits on 04/25/23.      Assessment and Plan             No orders of the defined types were placed in this encounter.   No follow-ups on file.   The patient was advised to call back or seek an in-person evaluation if the symptoms worsen or if the condition fails to improve as anticipated.  I discussed the assessment and treatment plan with the patient. The patient was provided an opportunity to ask questions and all were answered. The patient agreed with the  plan and demonstrated an understanding of the instructions.  I, Debera Lat, PA-C have reviewed all documentation for this visit. The documentation on 04/25/2023  for the exam, diagnosis, procedures, and orders are all accurate and complete.  Debera Lat, [facility], MMS [facility] [phone] (phone) [phone] (fax)  [facility]

## 2023-04-26 MED ORDER — CEFDINIR 300 MG PO CAPS: 600.0000 mg | ORAL_CAPSULE | Freq: Every day | ORAL | 0 refills | Status: DC

## 2023-04-30 ENCOUNTER — Encounter (HOSPITAL_COMMUNITY): Payer: Self-pay

## 2023-04-30 ENCOUNTER — Ambulatory Visit (HOSPITAL_COMMUNITY)
Admission: RE | Admit: 2023-04-30 | Discharge: 2023-04-30 | Disposition: A | Discharge status: Home / Self Care | Source: Ambulatory Visit | Attending: Vascular Surgery | Admitting: Vascular Surgery

## 2023-04-30 VITALS — BP 123/74 | HR 67

## 2023-04-30 DIAGNOSIS — I82431 Acute embolism and thrombosis of right popliteal vein: Secondary | ICD-10-CM | POA: Diagnosis not present

## 2023-04-30 LAB — CBC
HCT: 42.3 % (ref 39.0–52.0)
Hemoglobin: 13.9 g/dL (ref 13.0–17.0)
MCH: 30 pg (ref 26.0–34.0)
MCHC: 32.9 g/dL (ref 30.0–36.0)
MCV: 91.4 fL (ref 80.0–100.0)
Platelets: 112 10*3/uL — ABNORMAL LOW (ref 150–400)
RBC: 4.63 MIL/uL (ref 4.22–5.81)
RDW: 13.9 % (ref 11.5–15.5)
WBC: 3.5 10*3/uL — ABNORMAL LOW (ref 4.0–10.5)
nRBC: 0 % (ref 0.0–0.2)

## 2023-04-30 LAB — COMPREHENSIVE METABOLIC PANEL
ALT: 32 U/L (ref 0–44)
AST: 32 U/L (ref 15–41)
Albumin: 3.7 g/dL (ref 3.5–5.0)
Alkaline Phosphatase: 54 U/L (ref 38–126)
Anion gap: 10 (ref 5–15)
BUN: 14 mg/dL (ref 8–23)
CO2: 24 mmol/L (ref 22–32)
Calcium: 9.2 mg/dL (ref 8.9–10.3)
Chloride: 106 mmol/L (ref 98–111)
Creatinine, Ser: 1.08 mg/dL (ref 0.61–1.24)
GFR, Estimated: >60 mL/min (ref >60)
Glucose, Bld: 96 mg/dL (ref 70–99)
Potassium: 4.1 mmol/L (ref 3.5–5.1)
Sodium: 140 mmol/L (ref 135–145)
Total Bilirubin: 0.6 mg/dL (ref 0.0–1.2)
Total Protein: 7.4 g/dL (ref 6.5–8.1)

## 2023-04-30 MED ORDER — APIXABAN 5 MG PO TABS: 5.0000 mg | ORAL_TABLET | Freq: Two times a day (BID) | ORAL | 5 refills | Status: DC

## 2023-04-30 NOTE — Patient Instructions (Addendum)
-  Continue apixaban (Eliquis) 5 mg twice daily. -[facility] You may need to call the pharmacy to ask them to fill this when you start to run low on your current supply. Bring your coupon card with you and it will make this $10/month.  -We recommend you wear compression stockings (15-20 mmHg) as long as you are having swelling or pain. Be sure to purchase the correct size and take them off at night.  -We are referring you to see a hematologist (blood doctor) at the [facility]. If you don't hear from them within the next week, call [phone] to schedule your appointment.  -We checked labs today. I will call you with any concerns.  -It is important to take your medication around the same time every day.  -Avoid NSAIDs like ibuprofen (Advil, Motrin) and naproxen (Aleve) as well as aspirin doses over 100 mg daily. -Tylenol (acetaminophen) is the preferred over the counter pain medication to lower the risk of bleeding. -Be sure to alert all of your health care providers that you are taking an anticoagulant prior to starting a new medication or having a procedure. -Monitor for signs and symptoms of bleeding (abnormal bruising, prolonged bleeding, nose bleeds, bleeding from gums, discolored urine, black tarry stools). If you have fallen and hit your head OR if your bleeding is severe or not stopping, seek emergency care.  -Go to the emergency room if emergent signs and symptoms of new clot occur (new or worse swelling and pain in an arm or leg, shortness of breath, chest pain, fast or irregular heartbeats, lightheadedness, dizziness, fainting, coughing up blood) or if you experience a significant color change (pale or blue) in the extremity that has the DVT.   If you have any questions or need to reschedule an appointment, please call [phone] [facility]. If you are having an emergency, call 911 or present to the nearest emergency room.   What is a DVT?   -Deep vein thrombosis (DVT) is a condition in which a blood clot forms in a vein of the deep venous system which can occur in the lower leg, thigh, pelvis, arm, or neck. This condition is serious and can be life-threatening if the clot travels to the arteries of the lungs and causing a blockage (pulmonary embolism, PE). A DVT can also damage veins in the leg, which can lead to long-term venous disease, leg pain, swelling, discoloration, and ulcers or sores (post-thrombotic syndrome).  -Treatment may include taking an anticoagulant medication to prevent more clots from forming and the current clot from growing, wearing compression stockings, and/or surgical procedures to remove or dissolve the clot.

## 2023-04-30 NOTE — Progress Notes (Signed)
 DVT Clinic Note  Name: Alejandro Marquez     MRN: [id number]     DOB: 31-Oct-1955     Sex: male  PCP: Malva Limes, MD  Today's Visit: Visit Information: Initial Visit  Referred to DVT Clinic by: Primary Care - Dr. Neita Garnet Referred to CPP by: Dr. Chestine Spore Reason for referral:  Chief Complaint  Patient presents with   Med Management - DVT   HISTORY OF PRESENT ILLNESS: Alejandro Marquez is a 68 y.o. male with PMH T2DM, HTN, HLD, ho presents after diagnosis of DVT for medication management. In December patient was experiencing edema to the LLE, [place] was negative for DVT at that time, and he has been treated with multiple courses of antibiotics for cellulitis. He then had RLE edema start in February. Patient was diagnosed with a RLE DVT on 04/19/23 and started on Eliquis by his PCP's office with referral to DVT Clinic. Last [place] in the RLE prior to this was in 2022 and negative.  Today patient is ambulating well independently and is accompanied by his roommate. Reports that his RLE swelling has improved. He never had any pain in the leg. He hasn't had any issues working his two jobs. Denies any prior history of VTE. Denies abnormal bleeding or bruising. Denies missed doses of Eliquis, takes it at 7am and 6pm. Tried wearing compression stockings but they were too tight. He felt that elevation actually worsened his swelling. Denies SOB, CP, palpitations, dizziness. No recent injury, illness, immobility, travel.   Positive Thrombotic Risk Factors: None Present Bleeding Risk Factors: Age >65 years, Anticoagulant therapy  Negative Thrombotic Risk Factors: Previous VTE, Recent surgery (within 3 months), Recent trauma (within 3 months), Recent admission to hospital with acute illness (within 3 months), Paralysis, paresis, or recent plaster cast immobilization of lower extremity, Central venous catheterization, Bed rest >72 hours within 3 months, Sedentary journey lasting >8 hours within 4 weeks,  Pregnancy, Within 6 weeks postpartum, Recent cesarean section (within 3 months), Estrogen therapy, Testosterone therapy, Erythropoiesis-stimulating agent, Recent COVID diagnosis (within 3 months), Active cancer, Non-malignant, chronic inflammatory condition, Known thrombophilic condition, Smoking, Obesity, Older age  Rx Insurance Coverage: Commercial Rx Affordability: Eliquis cost him $25/month. Provided him with activated copay card to decrease cost of refills to $10/month.  Rx Assistance Provided: Co-pay card Preferred Pharmacy: [facility] [place]   Past Medical History:  Diagnosis Date   Diabetes mellitus without complication (HCC)    Syncope     Past Surgical History:  Procedure Laterality Date   HERNIA REPAIR  1968   HERNIA REPAIR      Social History   Socioeconomic History   Marital status: Single    Spouse name: Not on file   Number of children: Not on file   Years of education: Not on file   Highest education level: Not on file  Occupational History   Not on file  Tobacco Use   Smoking status: Never   Smokeless tobacco: Never  Vaping Use   Vaping status: Never Used  Substance and Sexual Activity   Alcohol use: Never   Drug use: Never   Sexual activity: Not on file  Other Topics Concern   Not on file  Social History Narrative   ** Merged History Encounter **       Social Drivers of Health   Financial Resource Strain: Not on file  Food Insecurity: Not on file  Transportation Needs: Not on file  Physical Activity:  Not on file  Stress: Not on file  Social Connections: Not on file  Intimate Partner Violence: Not on file    Family History  Problem Relation Age of Onset   Parkinson's disease Father    Diabetes Maternal Grandmother     Allergies as of 04/30/2023   (No Known Allergies)    Current Outpatient Medications on File Prior to Encounter  Medication Sig Dispense Refill   APIXABAN (ELIQUIS) VTE STARTER PACK (10MG  AND  5MG ) Take as directed on package: start with two-5mg  tablets twice daily for 7 days. On day 8, switch to one-5mg  tablet twice daily. 74 each 0   cefdinir (OMNICEF) 300 MG capsule Take 2 capsules (600 mg total) by mouth daily. 20 capsule 0   clobetasol ointment (TEMOVATE) 0.05 % Apply 1 Application topically 2 (two) times daily.     glipiZIDE (GLUCOTROL) 10 MG tablet TAKE 1 TABLET (10 MG TOTAL) BY MOUTH TWICE A DAY BEFORE A MEAL 180 tablet 1   JANUVIA 100 MG tablet TAKE 1 TABLET BY MOUTH EVERY DAY 90 tablet 1   lisinopril (ZESTRIL) 10 MG tablet TAKE 1 TABLET BY MOUTH EVERY DAY 90 tablet 0   metFORMIN (GLUCOPHAGE) 1000 MG tablet TAKE 1 TABLET (1,000 MG TOTAL) BY MOUTH TWICE A DAY WITH FOOD 60 tablet 11   sildenafil (VIAGRA) 50 MG tablet Take 1 tablet (50 mg total) by mouth daily as needed for erectile dysfunction. 30 tablet 5   No current facility-administered medications on file prior to encounter.   REVIEW OF SYSTEMS:  Review of Systems  Respiratory:  Negative for shortness of breath.   Cardiovascular:  Positive for leg swelling. Negative for chest pain and palpitations.  Musculoskeletal:  Negative for myalgias.  Neurological:  Negative for dizziness and tingling.   PHYSICAL EXAMINATION:  Vitals:   04/30/23 1352  BP: 123/74  Pulse: 67  SpO2: 100%   Physical Exam Vitals reviewed.  Cardiovascular:     Rate and Rhythm: Normal rate.  Pulmonary:     Effort: Pulmonary effort is normal.  Musculoskeletal:        General: No tenderness.     Right lower leg: Edema present.     Left lower leg: Edema present.  Skin:    Findings: Erythema present. No bruising.   Villalta Score for Post-Thrombotic Syndrome: Pain: Absent Cramps: Absent Heaviness: Absent Paresthesia: Absent Pruritus: Absent Pretibial Edema: Moderate Skin Induration: Absent Hyperpigmentation: Absent Redness: Mild Venous Ectasia: Absent Pain on calf compression: Absent Villalta Preliminary Score: 3 Is venous ulcer  present?: No If venous ulcer is present and score is <15, then 15 points total are assigned: Absent Villalta Total Score: 3  LABS:  CBC     Component Value Date/Time   WBC 4.9 11/21/2022 0815   WBC 14.3 (H) 06/13/2017 0726   RBC 4.57 11/21/2022 0815   RBC 5.70 06/13/2017 0726   HGB 14.6 11/21/2022 0815   HCT 42.9 11/21/2022 0815   PLT 89 (LL) 11/21/2022 0815   MCV 94 11/21/2022 0815   MCH 31.9 11/21/2022 0815   MCH 32.1 06/13/2017 0726   MCHC 34.0 11/21/2022 0815   MCHC 34.6 06/13/2017 0726   RDW 12.5 11/21/2022 0815   LYMPHSABS 1.1 12/10/2020 0817   MONOABS 0.7 06/13/2017 0726   EOSABS 0.2 12/10/2020 0817   BASOSABS 0.0 12/10/2020 0817    Hepatic Function      Component Value Date/Time   PROT 7.1 11/21/2022 0815   ALBUMIN 4.1 11/21/2022 0815   AST  33 11/21/2022 0815   ALT 41 11/21/2022 0815   ALKPHOS 66 11/21/2022 0815   BILITOT 0.8 11/21/2022 0815   BILIDIR 0.31 09/18/2016 1627    Renal Function   Lab Results  Component Value Date   CREATININE 1.03 11/21/2022   CREATININE 0.93 01/18/2022   CREATININE 1.02 12/10/2020    CrCl cannot be calculated (Patient's most recent lab result is older than the maximum 21 days allowed.).   [facility] Vascular Lab Studies:  04/19/23 DVT study IMPRESSION: Positive for deep venous thrombosis involving the right popliteal vein and 1 of the duplicated right femoral veins.  ASSESSMENT: Location of DVT: Right popliteal vein, Right femoral vein Cause of DVT: unprovoked  Patient with no prior history of VTE diagnosed with DVT 04/19/23 after new onset RLE edema. He was appropriately started on Eliquis starter pack immediately, and he has been taking it correctly. Swelling has already improved per patient though it has not yet resolved. Discussed the importance of compression and elevation to help improve his swelling. Extensively counseled patient on Eliquis. Provided refills to his preferred pharmacy and activated copay card to decrease cost  to $10/month. No provoking risk factors identified at this time, so we will refer him to hematology at [facility] for further workup and management of unprovoked DVT. Of note, on his last CBC 11/21/22 platelets were 89, and prior to that on 01/18/22 were 67. Per patient, he doesn't know why these were low and they have not been rechecked since then. Rechecking CBC and CMP today in light of new Eliquis start. If thrombocytopenia persists, will defer further management to PCP or hematology, which we are already referring him to. All of his questions have been answered at this time.   PLAN: -Continue apixaban (Eliquis) 5 mg twice daily. -Expected duration of therapy: pending further work up by hematology. Therapy started on 04/19/23. -Patient educated on purpose, proper use and potential adverse effects of apixaban (Eliquis). -Discussed importance of taking medication around the same time every day. -Advised patient of medications to avoid (NSAIDs, aspirin doses >100 mg daily). -Educated that Tylenol (acetaminophen) is the preferred analgesic to lower the risk of bleeding. -Advised patient to alert all providers of anticoagulation therapy prior to starting a new medication or having a procedure. -Emphasized importance of monitoring for signs and symptoms of bleeding (abnormal bruising, prolonged bleeding, nose bleeds, bleeding from gums, discolored urine, black tarry stools). -Educated patient to present to the ED if emergent signs and symptoms of new thrombosis occur. -Counseled patient to wear compression stockings daily, removing at night. Encouraged elevation as well to help improve swelling.   Follow up: Referral to hematology placed. DVT Clinic available as needed.   Pervis Hocking, PharmD, [place], CPP Deep [facility]in Thrombosis Clinic Clinical Pharmacist Practitioner

## 2023-05-05 ENCOUNTER — Other Ambulatory Visit: Payer: Self-pay | Admitting: Family Medicine

## 2023-05-05 DIAGNOSIS — I10 Essential (primary) hypertension: Secondary | ICD-10-CM

## 2023-05-13 ENCOUNTER — Other Ambulatory Visit: Payer: Self-pay | Admitting: Family Medicine

## 2023-05-13 DIAGNOSIS — E119 Type 2 diabetes mellitus without complications: Secondary | ICD-10-CM

## 2023-05-17 ENCOUNTER — Telehealth: Payer: Self-pay

## 2023-05-17 DIAGNOSIS — E119 Type 2 diabetes mellitus without complications: Secondary | ICD-10-CM

## 2023-05-17 NOTE — Telephone Encounter (Unsigned)
 Copied from CRM [id number]. Topic: Clinical - Prescription Issue >> May 17, 2023  3:37 PM Antwanette L wrote: Reason for CRM: [facility]  And [facility] told him it would be $325. The patient gets a 30 day supply for $25. The patient only has one pill left and will need to pick up the medicine tomorrow. The patient can be contacted by phone at  [phone].  Preferred Pharmacy [facility] [phone] [address] [place] [address] Phone: [phone] Fax: [phone]

## 2023-05-18 MED ORDER — SITAGLIPTIN PHOSPHATE 100 MG PO TABS: 100.0000 mg | ORAL_TABLET | Freq: Every day | ORAL | 1 refills | Status: DC

## 2023-05-18 NOTE — Telephone Encounter (Signed)
 LVMTCB

## 2023-05-18 NOTE — Telephone Encounter (Signed)
 Pt returned call and disconnected while being transferred to office. Pt had advised E2C2 agent that he is referring to his [place] rx.

## 2023-05-21 NOTE — Telephone Encounter (Signed)
 LMTCB

## 2023-06-29 ENCOUNTER — Encounter: Payer: Self-pay | Admitting: Family Medicine

## 2023-06-29 ENCOUNTER — Ambulatory Visit: Payer: BC Managed Care – PPO | Admitting: Family Medicine

## 2023-06-29 VITALS — BP 115/74 | HR 77 | Ht 71.0 in | Wt 184.2 lb

## 2023-06-29 DIAGNOSIS — E119 Type 2 diabetes mellitus without complications: Secondary | ICD-10-CM

## 2023-06-29 DIAGNOSIS — Z7984 Long term (current) use of oral hypoglycemic drugs: Secondary | ICD-10-CM

## 2023-06-29 DIAGNOSIS — E1159 Type 2 diabetes mellitus with other circulatory complications: Secondary | ICD-10-CM

## 2023-06-29 DIAGNOSIS — I82431 Acute embolism and thrombosis of right popliteal vein: Secondary | ICD-10-CM

## 2023-06-29 DIAGNOSIS — I152 Hypertension secondary to endocrine disorders: Secondary | ICD-10-CM | POA: Diagnosis not present

## 2023-06-29 LAB — POCT GLYCOSYLATED HEMOGLOBIN (HGB A1C): Hemoglobin A1C: 6.7 % — AB (ref 4.0–5.6)

## 2023-06-29 NOTE — Patient Instructions (Signed)
 Marland Kitchen  Please review the attached list of medications and notify my office if there are any errors.   . Please bring all of your medications to every appointment so we can make sure that our medication list is the same as yours.

## 2023-06-29 NOTE — Progress Notes (Signed)
 Established patient visit   Patient: Alejandro Marquez   DOB: October 21, 1955   68 y.o. Male  MRN: [id number] Visit Date: 06/29/2023  Today's healthcare provider: Jeralene Mom, MD   Chief Complaint  Patient presents with   Follow-up   Diabetes    No concerns, no sugar log,   Drinks sugar free soda, doesn't eat much but some carbs from sandwhiches   Subjective    Diabetes Pertinent negatives for diabetes include no chest pain.   Patient presents for follow up diabetes, hypertension,and right popliteal DVT 04/19/23. Feels well. Leg swelling has resolved. Tolerating Eliquis  well. There was no apparent provocation of his DVT.   Medications: Outpatient Medications Prior to Visit  Medication Sig   apixaban  (ELIQUIS ) 5 MG TABS tablet Take 1 tablet (5 mg total) by mouth 2 (two) times daily. Start taking after completion of starter pack.   clobetasol ointment (TEMOVATE) 0.05 % Apply 1 Application topically 2 (two) times daily.   glipiZIDE  (GLUCOTROL ) 10 MG tablet TAKE 1 TABLET BY MOUTH TWICE A DAY BEFORE MEALS   lisinopril  (ZESTRIL ) 10 MG tablet TAKE 1 TABLET BY MOUTH EVERY DAY   metFORMIN  (GLUCOPHAGE ) 1000 MG tablet TAKE 1 TABLET (1,000 MG TOTAL) BY MOUTH TWICE A DAY WITH FOOD   sildenafil  (VIAGRA ) 50 MG tablet Take 1 tablet (50 mg total) by mouth daily as needed for erectile dysfunction.   sitaGLIPtin  (JANUVIA ) 100 MG tablet Take 1 tablet (100 mg total) by mouth daily. TAKE 1 TABLET BY MOUTH EVERY DAY   [DISCONTINUED] APIXABAN  (ELIQUIS ) VTE STARTER PACK (10MG  AND 5MG ) Take as directed on package: start with two-5mg  tablets twice daily for 7 days. On day 8, switch to one-5mg  tablet twice daily.   [DISCONTINUED] cefdinir  (OMNICEF ) 300 MG capsule Take 2 capsules (600 mg total) by mouth daily.   No facility-administered medications prior to visit.    Review of Systems  Constitutional:  Negative for appetite change, chills and fever.  Respiratory:  Negative for chest tightness,  shortness of breath and wheezing.   Cardiovascular:  Negative for chest pain and palpitations.  Gastrointestinal:  Negative for abdominal pain, nausea and vomiting.       Objective    BP 115/74 (BP Location: Left Arm, Patient Position: Sitting, Cuff Size: Normal)   Pulse 77   Ht 5\' 11"  (1.803 m)   Wt 184 lb 3.2 oz (83.6 kg)   SpO2 96%   BMI 25.69 kg/m    Physical Exam    General: Appearance:    Well developed, well nourished male in no acute distress  Eyes:    PERRL, conjunctiva/corneas clear, EOM's intact       Lungs:     Clear to auscultation bilaterally, respirations unlabored  Heart:    Normal heart rate. Normal rhythm. No murmurs, rubs, or gallops.    MS:   All extremities are intact.  No LE swelling, tenderness or erythema.   Neurologic:   Awake, alert, oriented x 3. No apparent focal neurological defect.         Results for orders placed or performed in visit on 06/29/23  POCT glycosylated hemoglobin (Hb A1C)  Result Value Ref Range   Hemoglobin A1C 6.7 (A) 4.0 - 5.6 %    Assessment & Plan     1. Type 2 diabetes mellitus without complication, without long-term current use of insulin (HCC) (Primary) Well controlled.  Continue current medications.    2. Acute deep vein thrombosis (DVT) of popliteal  vein of right lower extremity (HCC) Tolerating Eliquis  well which he started 04/19/2022. Was unprovoked so will complete 6 month course to finish in August. Anticipate coag disorder work up when finished with Eliqus.   3. Hypertension associated with diabetes (HCC) Well controlled.  Continue current medications.     Return in about 4 months (around 10/30/2023).         Jeralene Mom, MD  [facility] [phone] (phone) [phone] (fax)  [facility]

## 2023-07-29 ENCOUNTER — Other Ambulatory Visit: Payer: Self-pay | Admitting: Family Medicine

## 2023-07-29 DIAGNOSIS — I10 Essential (primary) hypertension: Secondary | ICD-10-CM

## 2023-08-14 ENCOUNTER — Ambulatory Visit: Payer: Self-pay

## 2023-08-14 NOTE — Telephone Encounter (Signed)
  FYI Only or Action Required?: FYI only for provider.  Patient was last seen in primary care on 06/29/2023 by Gasper Nancyann BRAVO, MD. Called Nurse Triage reporting Foot Swelling. Symptoms began several days ago. Swollen left foot. Foot is red and warm. Pt thinks it is infected. Interventions attempted: Nothing. Symptoms are: gradually worsening.  Triage Disposition: See HCP Within 4 Hours (Or PCP Triage)  Patient/caregiver understands and will follow disposition?: yes - UC               Copied from CRM [id number]. Topic: Clinical - Red Word Triage >> Aug 14, 2023  8:42 AM Carlatta H wrote: [facility] that prompted transfer to Nurse Triage: Left foot swollen, redness and hard to walk// Reason for Disposition  [1] SEVERE pain (e.g., excruciating, unable to walk) AND [2] not improved after 2 hours of pain medicine  Answer Assessment - Initial Assessment Questions 1. LOCATION: Which ankle is swollen? Where is the swelling?     left 2. ONSET: When did the swelling start?     Couple of days 3. SWELLING: How bad is the swelling? Or, How large is it? (e.g., mild, moderate, severe; size of localized swelling)    - NONE: No joint swelling.   - LOCALIZED: Localized; small area of puffy or swollen skin (e.g., insect bite, skin irritation).   - MILD: Joint looks or feels mildly swollen or puffy.   - MODERATE: Swollen; interferes with normal activities (e.g., work or school); decreased range of movement; may be limping.   - SEVERE: Very swollen; can't move swollen joint at all; limping a lot or unable to walk.     moderate 4. PAIN: Is there any pain? If Yes, ask: How bad is it? (Scale 1-10; or mild, moderate, severe)   - NONE (0): no pain.   - MILD (1-3): doesn't interfere with normal activities.    - MODERATE (4-7): interferes with normal activities (e.g., work or school) or awakens from sleep, limping.    - SEVERE (8-10): excruciating pain, unable to do any normal activities,  unable to walk.      10/10 5. CAUSE: What do you think caused the ankle swelling?     infection 6. OTHER SYMPTOMS: Do you have any other symptoms? (e.g., fever, chest pain, difficulty breathing, calf pain)     Redness warmth  Protocols used: Ankle Swelling-A-AH

## 2023-08-15 ENCOUNTER — Inpatient Hospital Stay
Admission: EM | Admit: 2023-08-15 | Discharge: 2023-08-17 | DRG: 617 | Disposition: A | Discharge status: Home Health | Attending: Internal Medicine | Admitting: Internal Medicine

## 2023-08-15 ENCOUNTER — Other Ambulatory Visit: Payer: Self-pay

## 2023-08-15 ENCOUNTER — Emergency Department

## 2023-08-15 DIAGNOSIS — L97522 Non-pressure chronic ulcer of other part of left foot with fat layer exposed: Secondary | ICD-10-CM | POA: Diagnosis not present

## 2023-08-15 DIAGNOSIS — E876 Hypokalemia: Secondary | ICD-10-CM | POA: Diagnosis present

## 2023-08-15 DIAGNOSIS — E871 Hypo-osmolality and hyponatremia: Secondary | ICD-10-CM | POA: Diagnosis not present

## 2023-08-15 DIAGNOSIS — Z86718 Personal history of other venous thrombosis and embolism: Secondary | ICD-10-CM | POA: Diagnosis not present

## 2023-08-15 DIAGNOSIS — K7460 Unspecified cirrhosis of liver: Secondary | ICD-10-CM | POA: Diagnosis not present

## 2023-08-15 DIAGNOSIS — E1152 Type 2 diabetes mellitus with diabetic peripheral angiopathy with gangrene: Secondary | ICD-10-CM | POA: Diagnosis not present

## 2023-08-15 DIAGNOSIS — Z794 Long term (current) use of insulin: Secondary | ICD-10-CM | POA: Diagnosis not present

## 2023-08-15 DIAGNOSIS — E119 Type 2 diabetes mellitus without complications: Secondary | ICD-10-CM

## 2023-08-15 DIAGNOSIS — L03032 Cellulitis of left toe: Secondary | ICD-10-CM | POA: Diagnosis present

## 2023-08-15 DIAGNOSIS — E162 Hypoglycemia, unspecified: Secondary | ICD-10-CM | POA: Diagnosis not present

## 2023-08-15 DIAGNOSIS — E11649 Type 2 diabetes mellitus with hypoglycemia without coma: Secondary | ICD-10-CM | POA: Diagnosis not present

## 2023-08-15 DIAGNOSIS — Z7901 Long term (current) use of anticoagulants: Secondary | ICD-10-CM

## 2023-08-15 DIAGNOSIS — L859 Epidermal thickening, unspecified: Secondary | ICD-10-CM | POA: Diagnosis present

## 2023-08-15 DIAGNOSIS — E11621 Type 2 diabetes mellitus with foot ulcer: Secondary | ICD-10-CM | POA: Diagnosis not present

## 2023-08-15 DIAGNOSIS — M7732 Calcaneal spur, left foot: Secondary | ICD-10-CM | POA: Diagnosis not present

## 2023-08-15 DIAGNOSIS — E1169 Type 2 diabetes mellitus with other specified complication: Secondary | ICD-10-CM | POA: Diagnosis not present

## 2023-08-15 DIAGNOSIS — L03116 Cellulitis of left lower limb: Secondary | ICD-10-CM | POA: Diagnosis not present

## 2023-08-15 DIAGNOSIS — Z833 Family history of diabetes mellitus: Secondary | ICD-10-CM

## 2023-08-15 DIAGNOSIS — M869 Osteomyelitis, unspecified: Secondary | ICD-10-CM

## 2023-08-15 DIAGNOSIS — E1140 Type 2 diabetes mellitus with diabetic neuropathy, unspecified: Secondary | ICD-10-CM | POA: Diagnosis present

## 2023-08-15 DIAGNOSIS — E440 Moderate protein-calorie malnutrition: Secondary | ICD-10-CM | POA: Diagnosis present

## 2023-08-15 DIAGNOSIS — R4182 Altered mental status, unspecified: Secondary | ICD-10-CM | POA: Diagnosis not present

## 2023-08-15 DIAGNOSIS — I452 Bifascicular block: Secondary | ICD-10-CM | POA: Diagnosis present

## 2023-08-15 DIAGNOSIS — I10 Essential (primary) hypertension: Secondary | ICD-10-CM | POA: Diagnosis present

## 2023-08-15 DIAGNOSIS — Z79899 Other long term (current) drug therapy: Secondary | ICD-10-CM | POA: Diagnosis not present

## 2023-08-15 DIAGNOSIS — M129 Arthropathy, unspecified: Secondary | ICD-10-CM | POA: Diagnosis not present

## 2023-08-15 DIAGNOSIS — Z6823 Body mass index (BMI) 23.0-23.9, adult: Secondary | ICD-10-CM | POA: Diagnosis not present

## 2023-08-15 DIAGNOSIS — Z9889 Other specified postprocedural states: Secondary | ICD-10-CM | POA: Diagnosis not present

## 2023-08-15 DIAGNOSIS — Z7984 Long term (current) use of oral hypoglycemic drugs: Secondary | ICD-10-CM

## 2023-08-15 DIAGNOSIS — M86172 Other acute osteomyelitis, left ankle and foot: Secondary | ICD-10-CM | POA: Diagnosis not present

## 2023-08-15 DIAGNOSIS — R600 Localized edema: Secondary | ICD-10-CM | POA: Diagnosis not present

## 2023-08-15 DIAGNOSIS — L97529 Non-pressure chronic ulcer of other part of left foot with unspecified severity: Secondary | ICD-10-CM | POA: Diagnosis not present

## 2023-08-15 DIAGNOSIS — R609 Edema, unspecified: Secondary | ICD-10-CM | POA: Diagnosis not present

## 2023-08-15 DIAGNOSIS — M7662 Achilles tendinitis, left leg: Secondary | ICD-10-CM | POA: Diagnosis not present

## 2023-08-15 DIAGNOSIS — Z820 Family history of epilepsy and other diseases of the nervous system: Secondary | ICD-10-CM

## 2023-08-15 DIAGNOSIS — R55 Syncope and collapse: Secondary | ICD-10-CM | POA: Diagnosis not present

## 2023-08-15 DIAGNOSIS — L97521 Non-pressure chronic ulcer of other part of left foot limited to breakdown of skin: Secondary | ICD-10-CM | POA: Diagnosis not present

## 2023-08-15 DIAGNOSIS — M19072 Primary osteoarthritis, left ankle and foot: Secondary | ICD-10-CM | POA: Diagnosis not present

## 2023-08-15 LAB — BASIC METABOLIC PANEL WITH GFR
Anion gap: 6 (ref 5–15)
BUN: 20 mg/dL (ref 8–23)
CO2: 23 mmol/L (ref 22–32)
Calcium: 8.1 mg/dL — ABNORMAL LOW (ref 8.9–10.3)
Chloride: 103 mmol/L (ref 98–111)
Creatinine, Ser: 1.12 mg/dL (ref 0.61–1.24)
GFR, Estimated: >60 mL/min (ref >60)
Glucose, Bld: 274 mg/dL — ABNORMAL HIGH (ref 70–99)
Potassium: 3.7 mmol/L (ref 3.5–5.1)
Sodium: 132 mmol/L — ABNORMAL LOW (ref 135–145)

## 2023-08-15 LAB — HEMOGLOBIN A1C
Hgb A1c MFr Bld: 6.1 % — ABNORMAL HIGH (ref 4.8–5.6)
Mean Plasma Glucose: 128.37 mg/dL

## 2023-08-15 LAB — GLUCOSE, CAPILLARY
Glucose-Capillary: 159 mg/dL — ABNORMAL HIGH (ref 70–99)
Glucose-Capillary: 254 mg/dL — ABNORMAL HIGH (ref 70–99)

## 2023-08-15 LAB — COMPREHENSIVE METABOLIC PANEL WITH GFR
ALT: 30 U/L (ref 0–44)
AST: 38 U/L (ref 15–41)
Albumin: 3.3 g/dL — ABNORMAL LOW (ref 3.5–5.0)
Alkaline Phosphatase: 49 U/L (ref 38–126)
Anion gap: 9 (ref 5–15)
BUN: 29 mg/dL — ABNORMAL HIGH (ref 8–23)
CO2: 24 mmol/L (ref 22–32)
Calcium: 8.5 mg/dL — ABNORMAL LOW (ref 8.9–10.3)
Chloride: 99 mmol/L (ref 98–111)
Creatinine, Ser: 1.23 mg/dL (ref 0.61–1.24)
GFR, Estimated: >60 mL/min (ref >60)
Glucose, Bld: 104 mg/dL — ABNORMAL HIGH (ref 70–99)
Potassium: 2.9 mmol/L — ABNORMAL LOW (ref 3.5–5.1)
Sodium: 132 mmol/L — ABNORMAL LOW (ref 135–145)
Total Bilirubin: 1.7 mg/dL — ABNORMAL HIGH (ref 0.0–1.2)
Total Protein: 7.5 g/dL (ref 6.5–8.1)

## 2023-08-15 LAB — CBC
HCT: 39.4 % (ref 39.0–52.0)
Hemoglobin: 13.6 g/dL (ref 13.0–17.0)
MCH: 30.5 pg (ref 26.0–34.0)
MCHC: 34.5 g/dL (ref 30.0–36.0)
MCV: 88.3 fL (ref 80.0–100.0)
Platelets: 104 10*3/uL — ABNORMAL LOW (ref 150–400)
RBC: 4.46 MIL/uL (ref 4.22–5.81)
RDW: 13.7 % (ref 11.5–15.5)
WBC: 6.6 10*3/uL (ref 4.0–10.5)
nRBC: 0 % (ref 0.0–0.2)

## 2023-08-15 LAB — PREALBUMIN: Prealbumin: 7 mg/dL — ABNORMAL LOW (ref 18–38)

## 2023-08-15 LAB — CBG MONITORING, ED
Glucose-Capillary: 303 mg/dL — ABNORMAL HIGH (ref 70–99)
Glucose-Capillary: 31 mg/dL — CL (ref 70–99)
Glucose-Capillary: 121 mg/dL — ABNORMAL HIGH (ref 70–99)
Glucose-Capillary: 140 mg/dL — ABNORMAL HIGH (ref 70–99)

## 2023-08-15 LAB — LACTIC ACID, PLASMA: Lactic Acid, Venous: 1.1 mmol/L (ref 0.5–1.9)

## 2023-08-15 LAB — C-REACTIVE PROTEIN: CRP: 17.1 mg/dL — ABNORMAL HIGH (ref <1.0)

## 2023-08-15 LAB — PROTIME-INR
INR: 1.7 — ABNORMAL HIGH (ref 0.8–1.2)
Prothrombin Time: 20.9 s — ABNORMAL HIGH (ref 11.4–15.2)

## 2023-08-15 LAB — SEDIMENTATION RATE: Sed Rate: 53 mm/h — ABNORMAL HIGH (ref 0–20)

## 2023-08-15 LAB — D-DIMER, QUANTITATIVE: D-Dimer, Quant: 0.59 ug{FEU}/mL — ABNORMAL HIGH (ref 0.00–0.50)

## 2023-08-15 MED ORDER — ACETAMINOPHEN 650 MG RE SUPP: 650.0000 mg | Freq: Four times a day (QID) | RECTAL | Status: DC | PRN

## 2023-08-15 MED ORDER — POTASSIUM CHLORIDE CRYS ER 20 MEQ PO TBCR
40.0000 meq | EXTENDED_RELEASE_TABLET | Freq: Once | ORAL | Status: AC
  Administered 2023-08-15: 40 meq via ORAL
  Filled 2023-08-15: qty 2

## 2023-08-15 MED ORDER — METRONIDAZOLE 500 MG/100ML IV SOLN
500.0000 mg | Freq: Once | INTRAVENOUS | Status: AC
  Administered 2023-08-15: 500 mg via INTRAVENOUS
  Filled 2023-08-15: qty 100

## 2023-08-15 MED ORDER — ACETAMINOPHEN 325 MG PO TABS
650.0000 mg | ORAL_TABLET | Freq: Four times a day (QID) | ORAL | Status: DC | PRN
  Administered 2023-08-16: 650 mg via ORAL
  Filled 2023-08-15: qty 2

## 2023-08-15 MED ORDER — SODIUM CHLORIDE 0.9 % IV SOLN
2.0000 g | Freq: Once | INTRAVENOUS | Status: AC
  Administered 2023-08-15: 2 g via INTRAVENOUS
  Filled 2023-08-15: qty 12.5

## 2023-08-15 MED ORDER — LISINOPRIL 10 MG PO TABS
10.0000 mg | ORAL_TABLET | Freq: Every day | ORAL | Status: DC
  Administered 2023-08-15 – 2023-08-17 (×3): 10 mg via ORAL
  Filled 2023-08-15 (×3): qty 1

## 2023-08-15 MED ORDER — ONDANSETRON HCL 4 MG/2ML IJ SOLN: 4.0000 mg | Freq: Four times a day (QID) | INTRAMUSCULAR | Status: DC | PRN

## 2023-08-15 MED ORDER — ONDANSETRON HCL 4 MG PO TABS: 4.0000 mg | ORAL_TABLET | Freq: Four times a day (QID) | ORAL | Status: DC | PRN

## 2023-08-15 MED ORDER — DEXTROSE 10 % IV SOLN: INTRAVENOUS | Status: DC

## 2023-08-15 MED ORDER — CLOBETASOL PROPIONATE 0.05 % EX OINT: 1.0000 | TOPICAL_OINTMENT | Freq: Two times a day (BID) | CUTANEOUS | Status: DC

## 2023-08-15 MED ORDER — VANCOMYCIN HCL IN DEXTROSE 1-5 GM/200ML-% IV SOLN
1000.0000 mg | Freq: Once | INTRAVENOUS | Status: AC
  Administered 2023-08-15: 1000 mg via INTRAVENOUS
  Filled 2023-08-15: qty 200

## 2023-08-15 MED ORDER — OCTREOTIDE ACETATE 100 MCG/ML IJ SOLN
100.0000 ug | Freq: Once | INTRAMUSCULAR | Status: AC
  Administered 2023-08-15: 100 ug via SUBCUTANEOUS
  Filled 2023-08-15: qty 1

## 2023-08-15 MED ORDER — APIXABAN 5 MG PO TABS
5.0000 mg | ORAL_TABLET | Freq: Two times a day (BID) | ORAL | Status: DC
  Administered 2023-08-15 (×2): 5 mg via ORAL
  Filled 2023-08-15 (×3): qty 1

## 2023-08-15 MED ORDER — GADOBUTROL 1 MMOL/ML IV SOLN
7.0000 mL | Freq: Once | INTRAVENOUS | Status: AC | PRN
  Administered 2023-08-15: 7 mL via INTRAVENOUS

## 2023-08-15 MED ORDER — VANCOMYCIN HCL 500 MG/100ML IV SOLN
500.0000 mg | INTRAVENOUS | Status: DC
  Administered 2023-08-16 – 2023-08-17 (×2): 500 mg via INTRAVENOUS
  Filled 2023-08-15 (×2): qty 100

## 2023-08-15 MED ORDER — INSULIN ASPART 100 UNIT/ML IJ SOLN
0.0000 [IU] | INTRAMUSCULAR | Status: DC
  Administered 2023-08-15 – 2023-08-16 (×2): 3 [IU] via SUBCUTANEOUS
  Administered 2023-08-16: 2 [IU] via SUBCUTANEOUS
  Administered 2023-08-16: 3 [IU] via SUBCUTANEOUS
  Administered 2023-08-16 (×2): 2 [IU] via SUBCUTANEOUS
  Administered 2023-08-16 – 2023-08-17 (×3): 1 [IU] via SUBCUTANEOUS
  Filled 2023-08-15 (×9): qty 1

## 2023-08-15 MED ORDER — POTASSIUM CHLORIDE 10 MEQ/100ML IV SOLN
10.0000 meq | INTRAVENOUS | Status: AC
  Administered 2023-08-15 (×2): 10 meq via INTRAVENOUS
  Filled 2023-08-15 (×2): qty 100

## 2023-08-15 MED ORDER — DEXTROSE 50 % IV SOLN
1.0000 | Freq: Once | INTRAVENOUS | Status: AC
  Administered 2023-08-15: 50 mL via INTRAVENOUS
  Filled 2023-08-15: qty 50

## 2023-08-15 MED ORDER — SODIUM CHLORIDE 0.9 % IV BOLUS
1000.0000 mL | Freq: Once | INTRAVENOUS | Status: AC
  Administered 2023-08-15: 1000 mL via INTRAVENOUS

## 2023-08-15 MED ORDER — PIPERACILLIN-TAZOBACTAM 3.375 G IVPB
3.3750 g | Freq: Three times a day (TID) | INTRAVENOUS | Status: DC
  Administered 2023-08-15 – 2023-08-17 (×5): 3.375 g via INTRAVENOUS
  Filled 2023-08-15 (×6): qty 50

## 2023-08-15 NOTE — ED Provider Notes (Signed)
 [facility] Provider Note    Event Date/Time   First MD Initiated Contact with Patient 08/15/23 1055     (approximate)   History   Loss of Consciousness   HPI  Alejandro Marquez is a 68 year old male with history of T2DM, DVT on Eliquis , HTN presenting to the emergency department for evaluation following a syncopal episode.  Patient was at work today outside in the heat.  While there, he had a syncopal episode.  Denies hitting his head.  He was found to have initial BGL of 43.  Takes metformin  and glipizide .  Reports he has been compliant with this.  Did not have breakfast this morning which is atypical for him.  In addition, he reports a few days ago he thinks he stubbed his toe on something.  Since that time he has noticed a wound on his foot with erythema of his leg.  No fevers.  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     Physical Exam   Triage Vital Signs: ED Triage Vitals  Encounter Vitals Group     BP 08/15/23 1059 132/87     Girls Systolic BP Percentile --      Girls Diastolic BP Percentile --      Boys Systolic BP Percentile --      Boys Diastolic BP Percentile --      Pulse Rate 08/15/23 1059 98     Resp 08/15/23 1059 17     Temp 08/15/23 1059 98.2 F (36.8 C)     Temp Source 08/15/23 1059 Oral     SpO2 08/15/23 1059 97 %     Weight 08/15/23 1100 101 lb 13.6 oz (46.2 kg)     Height 08/15/23 1100 5' 11 (1.803 m)     Head Circumference --      Peak Flow --      Pain Score 08/15/23 1059 0     Pain Loc --      Pain Education --      Exclude from Growth Chart --     Most recent vital signs: Vitals:   08/15/23 1059 08/15/23 1200  BP: 132/87 (!) 141/78  Pulse: 98 89  Resp: 17 (!) 22  Temp: 98.2 F (36.8 C)   SpO2: 97% 100%     General: Awake, interactive  CV:  Regular rate, good peripheral perfusion.  Resp:  Unlabored respirations,  Abd:  Nondistended.  Neuro:  Symmetric  facial movement, fluid speech MSK:  There is erythema over the left lower extremity compared to the contralateral side with associated wounds over the feet most notably over the great toe, shallow ulcer over the plantar aspect of the foot.  There is a readily palpable 2+ DP pulse bilaterally.  There is bluish-purple discoloration of the toe.  Please see images below.          ED Results / Procedures / Treatments   Labs (all labs ordered are listed, but only abnormal results are displayed) Labs Reviewed  COMPREHENSIVE METABOLIC PANEL WITH GFR - Abnormal; Notable for the following components:      Result Value   Sodium 132 (*)    Potassium 2.9 (*)    Glucose, Bld 104 (*)    BUN 29 (*)    Calcium 8.5 (*)    Albumin 3.3 (*)    Total Bilirubin 1.7 (*)    All other components within normal limits  CBC -  Abnormal; Notable for the following components:   Platelets 104 (*)    All other components within normal limits  PROTIME-INR - Abnormal; Notable for the following components:   Prothrombin Time 20.9 (*)    INR 1.7 (*)    All other components within normal limits  CBG MONITORING, ED - Abnormal; Notable for the following components:   Glucose-Capillary 303 (*)    All other components within normal limits  CBG MONITORING, ED - Abnormal; Notable for the following components:   Glucose-Capillary 31 (*)    All other components within normal limits  CBG MONITORING, ED - Abnormal; Notable for the following components:   Glucose-Capillary 121 (*)    All other components within normal limits  CULTURE, BLOOD (ROUTINE X 2)  CULTURE, BLOOD (ROUTINE X 2)  LACTIC ACID, PLASMA  URINALYSIS, ROUTINE W REFLEX MICROSCOPIC  D-DIMER, QUANTITATIVE  CBG MONITORING, ED     EKG EKG independently reviewed and interpreted by myself demonstrates:  EKG demonstrates sinus rhythm at rate of 93, PR 152, QRS 153, QTc 477, 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 CONTRAST:  7mL GADAVIST GADOBUTROL 1 MMOL/ML IV SOLN COMPARISON:  Radiographs 08/15/2023 FINDINGS: Bones/Joint/Cartilage Diffuse low-level edema in the distal phalanx great toe as on image 10 series 7, favoring early osteomyelitis. No bony destructive findings. Erosive arthropathy laterally at the proximal interphalangeal joint of the fourth toe involving the head of the proximal phalanx and base of the middle phalanx as shown on image 18 series 5. Low-level marrow edema both bones. Osteomyelitis favored given the appearance on images 18-19 of series 5, correlate with any drainage or wound. Erosive arthropathy is a differential diagnostic consideration. Ligaments Lisfranc ligament intact. Muscles and Tendons Regional muscular atrophy. Soft tissues Dorsal subcutaneous edema in the forefoot extends into the toes. Reduced T1 signal in the subcutaneous tissues lateral to the fourth proximal interphalangeal joint, probably from infection/inflammation. Wound along the medial and plantar aspect of the great toe favoring ulceration. IMPRESSION: 1. Wound along the medial and plantar aspect of the great toe favoring ulceration. Diffuse low-level edema in the distal phalanx great toe favoring early osteomyelitis. 2. Erosive arthropathy laterally at the proximal interphalangeal joint of the fourth toe involving the head of the proximal phalanx and base of the  middle phalanx. Low-level marrow edema both bones. Osteomyelitis favored, correlate with any drainage or wound. Erosive arthropathy is a differential diagnostic consideration. 3. Dorsal subcutaneous edema in the forefoot extends into the toes. Reduced T1 signal in the subcutaneous tissues lateral to the fourth proximal interphalangeal joint, probably from infection/inflammation. 4. Regional muscular atrophy. Electronically Signed   By: Ryan Salvage M.D.   On: 08/15/2023 13:35   DG Foot Complete Left Result Date: 08/15/2023 CLINICAL DATA:  Left foot cellulitis. EXAM: LEFT FOOT - COMPLETE 3+ VIEW COMPARISON:  None Available. FINDINGS: There is no evidence of fracture or dislocation. A large plantar calcaneal spur is present. Mild degenerative changes are present along the dorsal aspect  of the mid left foot. Moderate severity soft tissue swelling is seen involving the left great toe. A small (approximately 5 mm) superficial soft tissue ulceration is also seen along the plantar aspect of the left great toe. IMPRESSION: Moderate severity soft tissue swelling involving the left great toe with a small superficial soft tissue ulceration along the plantar aspect of the left great toe. Electronically Signed   By: Suzen Dials M.D.   On: 08/15/2023 12:19   DG Chest Portable 1 View Result Date: 08/15/2023 CLINICAL DATA:  Syncopal episode. EXAM: PORTABLE CHEST 1 VIEW COMPARISON:  June 13, 2017 FINDINGS: The heart size and mediastinal contours are within normal limits. No acute infiltrate, pleural effusion or pneumothorax is identified. Multilevel degenerative changes seen throughout the thoracic spine. IMPRESSION: No active cardiopulmonary disease. Electronically Signed   By: Suzen Dials M.D.   On: 08/15/2023 12:17    PROCEDURES:  Critical Care performed: Yes, see critical care procedure note(s)  CRITICAL CARE Performed by: Nilsa Dade   Total critical care time: 31 minutes  Critical care time  was exclusive of separately billable procedures and treating other patients.  Critical care was necessary to treat or prevent imminent or life-threatening deterioration.  Critical care was time spent personally by me on the following activities: development of treatment plan with patient and/or surrogate as well as nursing, discussions with consultants, evaluation of patient's response to treatment, examination of patient, obtaining history from patient or surrogate, ordering and performing treatments and interventions, ordering and review of laboratory studies, ordering and review of radiographic studies, pulse oximetry and re-evaluation of patient's condition.   Procedures   MEDICATIONS ORDERED IN ED: Medications  vancomycin (VANCOCIN) IVPB 1000 mg/200 mL premix (1,000 mg Intravenous New Bag/Given 08/15/23 1441)  potassium chloride 10 mEq in 100 mL IVPB (10 mEq Intravenous New Bag/Given 08/15/23 1439)  dextrose 10 % infusion ( Intravenous New Bag/Given 08/15/23 1438)  ceFEPIme (MAXIPIME) 2 g in sodium chloride  0.9 % 100 mL IVPB (0 g Intravenous Stopped 08/15/23 1400)  metroNIDAZOLE (FLAGYL) IVPB 500 mg (0 mg Intravenous Stopped 08/15/23 1459)  sodium chloride  0.9 % bolus 1,000 mL (0 mLs Intravenous Stopped 08/15/23 1400)  gadobutrol (GADAVIST) 1 MMOL/ML injection 7 mL (7 mLs Intravenous Contrast Given 08/15/23 1301)  potassium chloride SA (KLOR-CON M) CR tablet 40 mEq (40 mEq Oral Given 08/15/23 1423)  dextrose 50 % solution 50 mL (50 mLs Intravenous Given 08/15/23 1405)  octreotide (SANDOSTATIN) injection 100 mcg (100 mcg Subcutaneous Given 08/15/23 1446)     IMPRESSION / MDM / ASSESSMENT AND PLAN / ED COURSE  I reviewed the triage vital signs and the nursing notes.  Differential diagnosis includes, but is not limited to, syncope related to heat exposure, poor p.o. intake, hypoglycemia secondary to sulfonylurea use, infection, cellulitis, no evidence of acute arterial occlusion, lower suspicion  DVT given clinical history of trauma, compliance with Eliquis   Patient's presentation is most consistent with acute presentation with potential threat to life or bodily function.  68 year old male presenting to the emergency department initially following a syncopal episode, found to be hypoglycemic as well as with a left foot wound on exam.  Stable vitals initially.  BGL 300s initially, on lab work did decrease to low 100s.  Patient with concerning foot exam, but intact pulses.  Case was reviewed with Dr. Malvin with podiatry who evaluated the patient at bedside.  X-Sriman Tally without subcutaneous gas.  He did recommend MRI of the foot, making the patient n.p.o. at midnight for  possible amputation, broad-spectrum antibiotics.  Plan for admission.  Prior to admission, repeat glucose obtained which was unfortunately recurrently low at 31.  Patient awake, talking.  Will order amp of D50, D10 drip, allow patient to eat.  With his recurrent hypoglycemia in the setting of sulfonylurea use, will also order subcutaneous octreotide.  Remains appropriate for admission.  Will reach out to hospitalist team.  Clinical Course as of 08/15/23 1509  Wed Aug 15, 2023  1433 Case discussed with hospitalist team.  They will evaluate for anticipated admission. [NR]    Clinical Course User Index [NR] Levander Slate, MD     FINAL CLINICAL IMPRESSION(S) / ED DIAGNOSES   Final diagnoses:  Hypoglycemia associated with type 2 diabetes mellitus (HCC)  Cellulitis of foot, left  Syncope and collapse     Rx / DC Orders   ED Discharge Orders     None        Note:  This document was prepared using Dragon voice recognition software and may include unintentional dictation errors.   Levander Slate, MD 08/15/23 [phone]

## 2023-08-15 NOTE — ED Notes (Addendum)
 Pt states he has new occurrence of cellulitis on his left foot. Dr. Levander at bedside. Upon pulling off sock on left foot, pt has large wound and ulcer on his left great toe. Great toe is black/blue in color with first layer of skin not intact off on the bottom and side portion of toe. Redness that spreads from the foot upwards into the calf. Left foot/calf edematous +3.

## 2023-08-15 NOTE — Consult Note (Signed)
 Pharmacy Antibiotic Note  Alejandro Marquez is a 68 y.o. male admitted on 08/15/2023 with cellulitis and wound infection.  Pharmacy has been consulted for pip/tazo and vancomycin dosing. Pt has a hx of DM and DVT and presented with necrotic ulceration of the left hallux with possible concern for osteomyelitis. Afeb, WBC 6.6. Scr slightly elevated from baseline (1).   MRI:  IMPRESSION: 1. Wound along the medial and plantar aspect of the great toe favoring ulceration. Diffuse low-level edema in the distal phalanx great toe favoring early osteomyelitis. 2. Erosive arthropathy laterally at the proximal interphalangeal joint of the fourth toe involving the head of the proximal phalanx and base of the middle phalanx. Low-level marrow edema both bones. Osteomyelitis favored, correlate with any drainage or wound. Erosive arthropathy is a differential diagnostic consideration. 3. Dorsal subcutaneous edema in the forefoot extends into the toes. Reduced T1 signal in the subcutaneous tissues lateral to the fourth proximal interphalangeal joint, probably from infection/inflammation. 4. Regional muscular atrophy.  Plan: Pt received vancomycin 1000 mg x 1 in the ED. Will start vancomycin 500 mg q24H. Predicted AUC 407 Goal AUC 400-550. Plan to order vancomycin level after 4th or 5th dose. Vd 0.72. TBW, Scr 1.23.   Start pip/tazo 3.375 mg q8H   Height: 5' 11 (180.3 cm) Weight: 46.2 kg (101 lb 13.6 oz) IBW/kg (Calculated) : 75.3  Temp (24hrs), Avg:98.2 F (36.8 C), Min:98.2 F (36.8 C), Max:98.2 F (36.8 C)  Recent Labs  Lab 08/15/23 1100 08/15/23 1119  WBC 6.6  --   CREATININE 1.23  --   LATICACIDVEN  --  1.1    Estimated Creatinine Clearance: 39.1 mL/min (by C-G formula based on SCr of 1.23 mg/dL).    No Known Allergies  Antimicrobials this admission: 6/25 vancomycin >>  6/25 pip/tazo >>   Dose adjustments this admission: None  Microbiology results: 6/25 BCx: pending   Thank you for  allowing pharmacy to be a part of this patient's care.  Cathaleen GORMAN Blanch, PharmD, BCPS 08/15/2023 3:20 PM

## 2023-08-15 NOTE — ED Notes (Signed)
 Pt A&O x4 at bedside. Dextrose given at this time.

## 2023-08-15 NOTE — ED Notes (Signed)
 No D10 solution in ED floor stock. Messaged pharm at this time.

## 2023-08-15 NOTE — ED Triage Notes (Addendum)
 pt in via [facility] for syncopal episode. Pt was working in a [employer] where [employer] reached a 110 degrees. EMS with an initial BS of 43.Hx of DM. Did take his metformin  this morning. Last BS from EMS is 150 after admin of 12.5 D10. A&Ox4 upon initial triage. Pt ambualtory to the bed. Gait steady. Current BS is 303.

## 2023-08-15 NOTE — ED Notes (Signed)
 1st set of cultures and lactic sent at this time.

## 2023-08-15 NOTE — H&P (Signed)
 History and Physical    [name] FMW:[id number] DOB: 1955-06-03 DOA: 08/15/2023  PCP: Alejandro Nancyann BRAVO, MD  Patient coming from: work   I have personally briefly reviewed patient's old medical records in [facility]  Chief Complaint: syncopal episode hypoglycemia   HPI: Alejandro Marquez is a 68 y.o. male with medical history significant of DMII , hx of Syncope, mild cirrhosis. DVT unprovoked, Hypertension  Who presents to ED s/p episode of syncope at which time in the field patient was found to be hypoglycemic to the 30's. Patient also has hx of redness and swelling of left lower extremity x 2days.  ED Course:  In Ed on further evaluation patient was also found to have left great toe wound with associated cellulitis of great toe with MRI noting  osteomyelitis.  Pdiatry was called who recommended npo for possible surgical intervention in am.   Vitals:  Afeb, bp 132/87, hr 98, rr 17, sat 97%  Fs-303- 31-121 Lactic 1.1 Nrs rbb/lafb Cxr NAD Xray foot MPRESSION: Moderate severity soft tissue swelling involving the left great toe with a small superficial soft tissue ulceration along the plantar aspect of the left great toe.   MRI foot IMPRESSION: 1. Wound along the medial and plantar aspect of the great toe favoring ulceration. Diffuse low-level edema in the distal phalanx great toe favoring early osteomyelitis. 2. Erosive arthropathy laterally at the proximal interphalangeal joint of the fourth toe involving the head of the proximal phalanx and base of the middle phalanx. Low-level marrow edema both bones. Osteomyelitis favored, correlate with any drainage or wound. Erosive arthropathy is a differential diagnostic consideration. 3. Dorsal subcutaneous edema in the forefoot extends into the toes. Reduced T1 signal in the subcutaneous tissues lateral to the fourth proximal interphalangeal joint, probably from infection/inflammation. 4. Regional muscular  atrophy.  Tx , cefepime, metronidazole potassium octreotide, Review of Systems: As per HPI otherwise 10 point review of systems negative.   Past Medical History:  Diagnosis Date   Diabetes mellitus without complication (HCC)    Syncope     Past Surgical History:  Procedure Laterality Date   HERNIA REPAIR  1968   HERNIA REPAIR       reports that he has never smoked. He has never used smokeless tobacco. He reports that he does not drink alcohol and does not use drugs.  No Known Allergies  Family History  Problem Relation Age of Onset   Parkinson's disease Father    Diabetes Maternal Grandmother     Prior to Admission medications   Medication Sig Start Date End Date Taking? Authorizing Provider  apixaban  (ELIQUIS ) 5 MG TABS tablet Take 1 tablet (5 mg total) by mouth 2 (two) times daily. Start taking after completion of starter pack. 04/30/23   Barbarann Dixon B, RPH-CPP  clobetasol ointment (TEMOVATE) 0.05 % Apply 1 Application topically 2 (two) times daily. 04/21/22   [provider]  glipiZIDE  (GLUCOTROL ) 10 MG tablet TAKE 1 TABLET BY MOUTH TWICE A DAY BEFORE MEALS 05/13/23   Alejandro Nancyann BRAVO, MD  lisinopril  (ZESTRIL ) 10 MG tablet TAKE 1 TABLET BY MOUTH EVERY DAY 07/29/23   Alejandro Nancyann BRAVO, MD  metFORMIN  (GLUCOPHAGE ) 1000 MG tablet TAKE 1 TABLET (1,000 MG TOTAL) BY MOUTH TWICE A DAY WITH FOOD 05/13/23   Alejandro Nancyann BRAVO, MD  sildenafil  (VIAGRA ) 50 MG tablet Take 1 tablet (50 mg total) by mouth daily as needed for erectile dysfunction. 11/17/22   Alejandro Nancyann BRAVO, MD  sitaGLIPtin  (JANUVIA )  100 MG tablet Take 1 tablet (100 mg total) by mouth daily. TAKE 1 TABLET BY MOUTH EVERY DAY 05/18/23   Alejandro Nancyann BRAVO, MD    Physical Exam: Vitals:   08/15/23 1059 08/15/23 1100 08/15/23 1200  BP: 132/87  (!) 141/78  Pulse: 98  89  Resp: 17  (!) 22  Temp: 98.2 F (36.8 C)    TempSrc: Oral    SpO2: 97%  100%  Weight:  46.2 kg   Height:  5' 11 (1.803 m)     Constitutional: NAD,  calm, comfortable Vitals:   08/15/23 1059 08/15/23 1100 08/15/23 1200  BP: 132/87  (!) 141/78  Pulse: 98  89  Resp: 17  (!) 22  Temp: 98.2 F (36.8 C)    TempSrc: Oral    SpO2: 97%  100%  Weight:  46.2 kg   Height:  5' 11 (1.803 m)    Eyes: PERRL, lids and conjunctivae normal ENMT: Mucous membranes are moist. Posterior pharynx clear of any exudate or lesions.Normal dentition.  Neck: normal, supple, no masses, no thyromegaly Respiratory: clear to auscultation bilaterally, no wheezing, no crackles. Normal respiratory effort. No accessory muscle use.  Cardiovascular: Regular rate and rhythm, no murmurs / rubs / gallops. +extremity edema.  Abdomen: no tenderness, no masses palpated. No hepatosplenomegaly. Bowel sounds positive.  Musculoskeletal: no clubbing / cyanosis. No joint deformity upper and lower extremities. Good ROM, no contractures. Normal muscle tone.  Skin: no rashes, lesions, ulcers. No induration Neurologic: CN grossly intact. Sensation intact,  5/5 in all ext Psychiatric: Normal judgment and insight. Alert and oriented x 3. Normal mood.    Labs on Admission: I have personally reviewed following labs and imaging studies  CBC: Recent Labs  Lab 08/15/23 1100  WBC 6.6  HGB 13.6  HCT 39.4  MCV 88.3  PLT 104*   Basic Metabolic Panel: Recent Labs  Lab 08/15/23 1100  NA 132*  K 2.9*  CL 99  CO2 24  GLUCOSE 104*  BUN 29*  CREATININE 1.23  CALCIUM 8.5*   GFR: Estimated Creatinine Clearance: 39.1 mL/min (by C-G formula based on SCr of 1.23 mg/dL). Liver Function Tests: Recent Labs  Lab 08/15/23 1100  AST 38  ALT 30  ALKPHOS 49  BILITOT 1.7*  PROT 7.5  ALBUMIN 3.3*   No results for input(s): LIPASE, AMYLASE in the last 168 hours. No results for input(s): AMMONIA in the last 168 hours. Coagulation Profile: Recent Labs  Lab 08/15/23 1100  INR 1.7*   Cardiac Enzymes: No results for input(s): CKTOTAL, CKMB, CKMBINDEX, TROPONINI in the  last 168 hours. BNP (last 3 results) No results for input(s): PROBNP in the last 8760 hours. HbA1C: No results for input(s): HGBA1C in the last 72 hours. CBG: Recent Labs  Lab 08/15/23 1059 08/15/23 1358  GLUCAP 303* 31*   Lipid Profile: No results for input(s): CHOL, HDL, LDLCALC, TRIG, CHOLHDL, LDLDIRECT in the last 72 hours. Thyroid Function Tests: No results for input(s): TSH, T4TOTAL, FREET4, T3FREE, THYROIDAB in the last 72 hours. Anemia Panel: No results for input(s): VITAMINB12, FOLATE, FERRITIN, TIBC, IRON, RETICCTPCT in the last 72 hours. Urine analysis: No results found for: COLORURINE, APPEARANCEUR, LABSPEC, PHURINE, GLUCOSEU, HGBUR, BILIRUBINUR, KETONESUR, PROTEINUR, UROBILINOGEN, NITRITE, LEUKOCYTESUR  Radiological Exams on Admission: MR TOES LEFT W WO CONTRAST Result Date: 08/15/2023 CLINICAL DATA:  Left great toe ulcer and discoloration EXAM: MRI OF THE LEFT TOES WITHOUT AND WITH CONTRAST TECHNIQUE: Multiplanar, multisequence MR imaging of the left toes was performed  both before and after administration of intravenous contrast. CONTRAST:  7mL GADAVIST GADOBUTROL 1 MMOL/ML IV SOLN COMPARISON:  Radiographs 08/15/2023 FINDINGS: Bones/Joint/Cartilage Diffuse low-level edema in the distal phalanx great toe as on image 10 series 7, favoring early osteomyelitis. No bony destructive findings. Erosive arthropathy laterally at the proximal interphalangeal joint of the fourth toe involving the head of the proximal phalanx and base of the middle phalanx as shown on image 18 series 5. Low-level marrow edema both bones. Osteomyelitis favored given the appearance on images 18-19 of series 5, correlate with any drainage or wound. Erosive arthropathy is a differential diagnostic consideration. Ligaments Lisfranc ligament intact. Muscles and Tendons Regional muscular atrophy. Soft tissues Dorsal subcutaneous edema in the forefoot  extends into the toes. Reduced T1 signal in the subcutaneous tissues lateral to the fourth proximal interphalangeal joint, probably from infection/inflammation. Wound along the medial and plantar aspect of the great toe favoring ulceration. IMPRESSION: 1. Wound along the medial and plantar aspect of the great toe favoring ulceration. Diffuse low-level edema in the distal phalanx great toe favoring early osteomyelitis. 2. Erosive arthropathy laterally at the proximal interphalangeal joint of the fourth toe involving the head of the proximal phalanx and base of the middle phalanx. Low-level marrow edema both bones. Osteomyelitis favored, correlate with any drainage or wound. Erosive arthropathy is a differential diagnostic consideration. 3. Dorsal subcutaneous edema in the forefoot extends into the toes. Reduced T1 signal in the subcutaneous tissues lateral to the fourth proximal interphalangeal joint, probably from infection/inflammation. 4. Regional muscular atrophy. Electronically Signed   By: Ryan Salvage M.D.   On: 08/15/2023 13:35   DG Foot Complete Left Result Date: 08/15/2023 CLINICAL DATA:  Left foot cellulitis. EXAM: LEFT FOOT - COMPLETE 3+ VIEW COMPARISON:  None Available. FINDINGS: There is no evidence of fracture or dislocation. A large plantar calcaneal spur is present. Mild degenerative changes are present along the dorsal aspect of the mid left foot. Moderate severity soft tissue swelling is seen involving the left great toe. A small (approximately 5 mm) superficial soft tissue ulceration is also seen along the plantar aspect of the left great toe. IMPRESSION: Moderate severity soft tissue swelling involving the left great toe with a small superficial soft tissue ulceration along the plantar aspect of the left great toe. Electronically Signed   By: Suzen Dials M.D.   On: 08/15/2023 12:19   DG Chest Portable 1 View Result Date: 08/15/2023 CLINICAL DATA:  Syncopal episode. EXAM:  PORTABLE CHEST 1 VIEW COMPARISON:  June 13, 2017 FINDINGS: The heart size and mediastinal contours are within normal limits. No acute infiltrate, pleural effusion or pneumothorax is identified. Multilevel degenerative changes seen throughout the thoracic spine. IMPRESSION: No active cardiopulmonary disease. Electronically Signed   By: Suzen Dials M.D.   On: 08/15/2023 12:17    EKG: Independently reviewed.   Assessment/Plan  Osteomyelitis of left great toe  -continue on vanc/zosyn  -npo midnight  -f/u further podiatry recs   Uncontrolled DMII with Hypoglycemia associated with syncope  -s/p treatment fs not in 120's  -wean Dextrose as able  -q4h fs  -check A1c  -hold oral hypoglycemic  -will check echo to be complete  -CTH  Hx of DVT -continue Eliquis    Hypertension -resume as bp tolerates  DVT prophylaxis: patient  expected to be admitted greater than 2 midnights  Code Status: full/ as discussed per patient wishes in event of cardiac arrest  Family Communication: none at bedside Disposition Plan: patient  expected to  be admitted greater than 2 midnights  Consults called: Podiatry  Admission status: progressive    Camila DELENA Ned MD [facility]   If 7PM-7AM, please contact night-coverage [email or web address] Password [email or web address]  08/15/2023, 2:39 PM

## 2023-08-15 NOTE — ED Notes (Signed)
 1st set of cultures and lactic collected at this time.

## 2023-08-15 NOTE — Consult Note (Signed)
 PODIATRY CONSULTATION  NAME Alejandro Marquez MRN [id number] DOB 11-19-1955 DOA 08/15/2023   Reason for consult:  Left great toe wounds  Attending/Consulting physician: N. Ray MD  History of present illness: 68 y.o. male with history of DM2 with neuropathy DVT on Eliquis  hypertension presented to the ED for concern for left great toe ulceration with associated erythema and swelling of the left foot.  He also reports he was brought in from work due to blood glucose of 43.  He did report he did not eat anything after taking his diabetes meds this morning.  He reports that there are wounds on the left great toe been present for at least a couple days not sure how this started.  He does report absence of sensation in the toe due to diabetes.  Works at a [employer].   Past Medical History:  Diagnosis Date   Diabetes mellitus without complication (HCC)    Syncope        Latest Ref Rng & Units 08/15/2023   11:00 AM 04/30/2023    1:59 PM 11/21/2022    8:15 AM  CBC  WBC 4.0 - 10.5 K/uL 6.6  3.5  4.9   Hemoglobin 13.0 - 17.0 g/dL 86.3  86.0  85.3   Hematocrit 39.0 - 52.0 % 39.4  42.3  42.9   Platelets 150 - 400 K/uL 104  112  89        Latest Ref Rng & Units 08/15/2023   11:00 AM 04/30/2023    1:59 PM 11/21/2022    8:15 AM  BMP  Glucose 70 - 99 mg/dL 895  96  807   BUN 8 - 23 mg/dL 29  14  15    Creatinine 0.61 - 1.24 mg/dL 8.76  8.91  8.96   BUN/Creat Ratio 10 - 24   15   Sodium 135 - 145 mmol/L 132  140  139   Potassium 3.5 - 5.1 mmol/L 2.9  4.1  4.5   Chloride 98 - 111 mmol/L 99  106  102   CO2 22 - 32 mmol/L 24  24  24    Calcium 8.9 - 10.3 mg/dL 8.5  9.2  9.2       Physical Exam: Lower Extremity Exam Left lower extremity with significant erythema and edema to the calf level DP and PT pulses are strong palpable 2+ on the left foot Cap refill is diminished to the left great toe however intact Ulceration present medial lateral aspect left hallux with necrotic sloughing skin  with underlying ulceration to the subcutaneous fat tissue and associated necrotic tissues. Mild malodor and mild seropurulent drainage No obvious crepitus noted on exam        ASSESSMENT/PLAN OF CARE 68 y.o. male with PMHx significant for DM2 with neuropathy DVT on Eliquis  hypertension with necrotic ulcerations of the left hallux with concern for possible underlying osteomyelitis.  Associated cellulitis.  Afebrile HR 98 respiratory rate 17 BP 132/87 WBC 6.6 Lactic acid 1.1 XR left foot attention directed left hallux there is no obvious subcutaneous emphysema on AP or lateral views no evidence of gas producing infection.  No obvious osseous erosions  -Discussed with patient he may require left great toe amputation pending MRI result.  Ordered MRI left toes with and without contrast.  Will follow-up on results later.  Currently he is stable with out significant vitals or lab abnormality and therefore do not see a need for urgent amputation at this time though recommend continue to  monitor closely and please notify me if his vitals or lab worsen significantly.  NPO p MN for possible OR tomorrow afternoon pending MRI results.  - Continue IV abx broad spectrum pending further culture data - Anticoagulation: ok to continue per primary vs switch eliquis  to heparin gtt - Wound care: Betadine dressing applied, please leave on - WB status: WBAT to Left foot - Will continue to follow   Thank you for the consult.  Please contact me directly with any questions or concerns.           Marolyn JULIANNA Honour, DPM [facility] / [facility]    [address] [place], [place] [address]                Office [phone]  Fax [phone]

## 2023-08-15 NOTE — ED Notes (Signed)
 Pt currently not in rm. Unable to start fluids and antibiotics at this time.

## 2023-08-16 ENCOUNTER — Inpatient Hospital Stay: Admitting: Certified Registered"

## 2023-08-16 ENCOUNTER — Encounter: Payer: Self-pay | Admitting: Internal Medicine

## 2023-08-16 ENCOUNTER — Other Ambulatory Visit: Payer: Self-pay

## 2023-08-16 ENCOUNTER — Inpatient Hospital Stay

## 2023-08-16 ENCOUNTER — Encounter
Admission: EM | Disposition: A | Discharge status: Home Health | Payer: Self-pay | Source: Home / Self Care | Attending: Internal Medicine

## 2023-08-16 DIAGNOSIS — M86172 Other acute osteomyelitis, left ankle and foot: Secondary | ICD-10-CM | POA: Diagnosis not present

## 2023-08-16 DIAGNOSIS — M869 Osteomyelitis, unspecified: Secondary | ICD-10-CM | POA: Diagnosis not present

## 2023-08-16 DIAGNOSIS — I10 Essential (primary) hypertension: Secondary | ICD-10-CM | POA: Diagnosis not present

## 2023-08-16 DIAGNOSIS — E162 Hypoglycemia, unspecified: Secondary | ICD-10-CM | POA: Diagnosis not present

## 2023-08-16 DIAGNOSIS — L97522 Non-pressure chronic ulcer of other part of left foot with fat layer exposed: Secondary | ICD-10-CM | POA: Diagnosis not present

## 2023-08-16 DIAGNOSIS — E440 Moderate protein-calorie malnutrition: Secondary | ICD-10-CM

## 2023-08-16 DIAGNOSIS — Z86718 Personal history of other venous thrombosis and embolism: Secondary | ICD-10-CM

## 2023-08-16 DIAGNOSIS — E119 Type 2 diabetes mellitus without complications: Secondary | ICD-10-CM | POA: Diagnosis not present

## 2023-08-16 HISTORY — PX: AMPUTATION TOE: SHX6595

## 2023-08-16 LAB — GLUCOSE, CAPILLARY
Glucose-Capillary: 150 mg/dL — ABNORMAL HIGH (ref 70–99)
Glucose-Capillary: 155 mg/dL — ABNORMAL HIGH (ref 70–99)
Glucose-Capillary: 180 mg/dL — ABNORMAL HIGH (ref 70–99)
Glucose-Capillary: 207 mg/dL — ABNORMAL HIGH (ref 70–99)
Glucose-Capillary: 208 mg/dL — ABNORMAL HIGH (ref 70–99)
Glucose-Capillary: 247 mg/dL — ABNORMAL HIGH (ref 70–99)
Glucose-Capillary: 255 mg/dL — ABNORMAL HIGH (ref 70–99)
Glucose-Capillary: 297 mg/dL — ABNORMAL HIGH (ref 70–99)

## 2023-08-16 LAB — URINALYSIS, ROUTINE W REFLEX MICROSCOPIC
Bacteria, UA: NONE SEEN
Bilirubin Urine: NEGATIVE
Glucose, UA: >=500 mg/dL — AB
Hgb urine dipstick: NEGATIVE
Ketones, ur: NEGATIVE mg/dL
Leukocytes,Ua: NEGATIVE
Nitrite: NEGATIVE
Protein, ur: NEGATIVE mg/dL
RBC / HPF: 0 RBC/hpf (ref 0–5)
Specific Gravity, Urine: 1.009 (ref 1.005–1.030)
Squamous Epithelial / HPF: 0 /HPF (ref 0–5)
pH: 5 (ref 5.0–8.0)

## 2023-08-16 LAB — HIV ANTIBODY (ROUTINE TESTING W REFLEX): HIV Screen 4th Generation wRfx: NONREACTIVE

## 2023-08-16 LAB — BASIC METABOLIC PANEL WITH GFR
Anion gap: 6 (ref 5–15)
BUN: 17 mg/dL (ref 8–23)
CO2: 24 mmol/L (ref 22–32)
Calcium: 8.3 mg/dL — ABNORMAL LOW (ref 8.9–10.3)
Chloride: 105 mmol/L (ref 98–111)
Creatinine, Ser: 0.99 mg/dL (ref 0.61–1.24)
GFR, Estimated: >60 mL/min (ref >60)
Glucose, Bld: 217 mg/dL — ABNORMAL HIGH (ref 70–99)
Potassium: 3.8 mmol/L (ref 3.5–5.1)
Sodium: 135 mmol/L (ref 135–145)

## 2023-08-16 LAB — CULTURE, BLOOD (ROUTINE X 2)

## 2023-08-16 SURGERY — AMPUTATION, TOE
Anesthesia: Monitor Anesthesia Care | Site: Toe | Laterality: Left

## 2023-08-16 MED ORDER — LIDOCAINE HCL (CARDIAC) PF 100 MG/5ML IV SOSY
PREFILLED_SYRINGE | INTRAVENOUS | Status: DC | PRN
Start: 2023-08-16 — End: 2023-08-16
  Administered 2023-08-16: 40 mg via INTRAVENOUS

## 2023-08-16 MED ORDER — LIDOCAINE HCL (PF) 1 % IJ SOLN
INTRAMUSCULAR | Status: AC
  Filled 2023-08-16: qty 30

## 2023-08-16 MED ORDER — PROPOFOL 10 MG/ML IV BOLUS
INTRAVENOUS | Status: AC
  Filled 2023-08-16: qty 20

## 2023-08-16 MED ORDER — SODIUM CHLORIDE 0.9 % IV SOLN: INTRAVENOUS | Status: DC | PRN

## 2023-08-16 MED ORDER — MIDAZOLAM HCL 2 MG/2ML IJ SOLN
INTRAMUSCULAR | Status: DC | PRN
Start: 2023-08-16 — End: 2023-08-16
  Administered 2023-08-16: 2 mg via INTRAVENOUS

## 2023-08-16 MED ORDER — PROPOFOL 500 MG/50ML IV EMUL
INTRAVENOUS | Status: DC | PRN
  Administered 2023-08-16: 60 ug/kg/min via INTRAVENOUS

## 2023-08-16 MED ORDER — JUVEN PO PACK
1.0000 | PACK | Freq: Two times a day (BID) | ORAL | Status: DC
  Administered 2023-08-17: 1 via ORAL

## 2023-08-16 MED ORDER — ENSURE MAX PROTEIN PO LIQD: 11.0000 [oz_av] | Freq: Every day | ORAL | Status: DC

## 2023-08-16 MED ORDER — VITAMIN C 500 MG PO TABS
500.0000 mg | ORAL_TABLET | Freq: Two times a day (BID) | ORAL | Status: DC
  Administered 2023-08-16 – 2023-08-17 (×3): 500 mg via ORAL
  Filled 2023-08-16 (×3): qty 1

## 2023-08-16 MED ORDER — LACTATED RINGERS IV SOLN: INTRAVENOUS | Status: DC

## 2023-08-16 MED ORDER — MIDAZOLAM HCL 2 MG/2ML IJ SOLN
INTRAMUSCULAR | Status: AC
  Filled 2023-08-16: qty 2

## 2023-08-16 MED ORDER — OXYCODONE HCL 5 MG PO TABS: 5.0000 mg | ORAL_TABLET | Freq: Once | ORAL | Status: DC | PRN

## 2023-08-16 MED ORDER — LIDOCAINE HCL 1 % IJ SOLN
INTRAMUSCULAR | Status: DC | PRN
  Administered 2023-08-16: 5 mL

## 2023-08-16 MED ORDER — ZINC SULFATE 220 (50 ZN) MG PO CAPS
220.0000 mg | ORAL_CAPSULE | Freq: Every day | ORAL | Status: DC
  Administered 2023-08-17: 220 mg via ORAL
  Filled 2023-08-16: qty 1

## 2023-08-16 MED ORDER — ADULT MULTIVITAMIN W/MINERALS CH
1.0000 | ORAL_TABLET | Freq: Every day | ORAL | Status: DC
  Administered 2023-08-17: 1
  Filled 2023-08-16: qty 1

## 2023-08-16 MED ORDER — BUPIVACAINE HCL (PF) 0.5 % IJ SOLN
INTRAMUSCULAR | Status: AC
Start: 2023-08-16 — End: 2023-08-16
  Filled 2023-08-16: qty 30

## 2023-08-16 MED ORDER — OXYCODONE HCL 5 MG/5ML PO SOLN: 5.0000 mg | Freq: Once | ORAL | Status: DC | PRN

## 2023-08-16 MED ORDER — FENTANYL CITRATE (PF) 100 MCG/2ML IJ SOLN: 25.0000 ug | INTRAMUSCULAR | Status: DC | PRN

## 2023-08-16 MED ORDER — 0.9 % SODIUM CHLORIDE (POUR BTL) OPTIME
TOPICAL | Status: DC | PRN
Start: 2023-08-16 — End: 2023-08-16
  Administered 2023-08-16: 500 mL

## 2023-08-16 MED ORDER — BUPIVACAINE HCL 0.5 % IJ SOLN
INTRAMUSCULAR | Status: DC | PRN
  Administered 2023-08-16: 5 mL

## 2023-08-16 SURGICAL SUPPLY — 50 items
BLADE MED AGGRESSIVE (BLADE) ×1 IMPLANT
BLADE OSC/SAGITTAL MD 5.5X18 (BLADE) IMPLANT
BLADE SURG 15 STRL LF DISP TIS (BLADE) ×2 IMPLANT
BLADE SURG MINI STRL (BLADE) ×1 IMPLANT
BNDG ELASTIC 4INX 5YD STR LF (GAUZE/BANDAGES/DRESSINGS) ×1 IMPLANT
BNDG ESMARCH 4X12 STRL LF (GAUZE/BANDAGES/DRESSINGS) ×1 IMPLANT
BNDG GAUZE DERMACEA FLUFF 4 (GAUZE/BANDAGES/DRESSINGS) ×1 IMPLANT
CNTNR URN SCR LID CUP LEK RST (MISCELLANEOUS) IMPLANT
CUFF TOURN SGL QUICK 12 (TOURNIQUET CUFF) IMPLANT
CUFF TOURN SGL QUICK 18X4 (TOURNIQUET CUFF) IMPLANT
DRAPE FLUOR MINI C-ARM 54X84 (DRAPES) IMPLANT
DRSG EMULSION OIL 3X3 NADH (GAUZE/BANDAGES/DRESSINGS) IMPLANT
DRSG EMULSION OIL 3X8 NADH (GAUZE/BANDAGES/DRESSINGS) IMPLANT
DRSG TELFA 3X8 NADH STRL (GAUZE/BANDAGES/DRESSINGS) ×1 IMPLANT
DURAPREP 26ML APPLICATOR (WOUND CARE) ×1 IMPLANT
ELECTRODE REM PT RTRN 9FT ADLT (ELECTROSURGICAL) ×1 IMPLANT
GAUZE PAD ABD 8X10 STRL (GAUZE/BANDAGES/DRESSINGS) ×1 IMPLANT
GAUZE SPONGE 4X4 12PLY STRL (GAUZE/BANDAGES/DRESSINGS) ×2 IMPLANT
GAUZE STRETCH 2X75IN STRL (MISCELLANEOUS) IMPLANT
GAUZE XEROFORM 1X8 LF (GAUZE/BANDAGES/DRESSINGS) ×1 IMPLANT
GLOVE BIOGEL PI IND STRL 7.5 (GLOVE) ×1 IMPLANT
GLOVE SURG SYN 7.5 E (GLOVE) ×1 IMPLANT
GLOVE SURG SYN 7.5 PF PI (GLOVE) ×1 IMPLANT
GOWN STRL REUS W/ TWL LRG LVL3 (GOWN DISPOSABLE) IMPLANT
GOWN STRL REUS W/ TWL XL LVL3 (GOWN DISPOSABLE) ×1 IMPLANT
HANDPIECE VERSAJET DEBRIDEMENT (MISCELLANEOUS) IMPLANT
KIT TURNOVER KIT A (KITS) ×1 IMPLANT
LABEL OR SOLS (LABEL) ×1 IMPLANT
MANIFOLD NEPTUNE II (INSTRUMENTS) ×1 IMPLANT
NDL FILTER BLUNT 18X1 1/2 (NEEDLE) ×1 IMPLANT
NDL HYPO 25X1 1.5 SAFETY (NEEDLE) ×2 IMPLANT
NEEDLE FILTER BLUNT 18X1 1/2 (NEEDLE) ×1 IMPLANT
NEEDLE HYPO 25X1 1.5 SAFETY (NEEDLE) ×2 IMPLANT
NS IRRIG 500ML POUR BTL (IV SOLUTION) ×1 IMPLANT
PACK EXTREMITY ARMC (MISCELLANEOUS) ×1 IMPLANT
PAD ABD DERMACEA PRESS 5X9 (GAUZE/BANDAGES/DRESSINGS) ×1 IMPLANT
PAD PREP OB/GYN DISP 24X41 (PERSONAL CARE ITEMS) ×1 IMPLANT
PENCIL SMOKE EVACUATOR (MISCELLANEOUS) ×1 IMPLANT
SOL .9 NS 3000ML IRR UROMATIC (IV SOLUTION) IMPLANT
SOLUTION PREP PVP 2OZ (MISCELLANEOUS) ×1 IMPLANT
STAPLER SKIN PROX 35W (STAPLE) ×1 IMPLANT
STOCKINETTE 48X4 2 PLY STRL (GAUZE/BANDAGES/DRESSINGS) ×1 IMPLANT
STOCKINETTE STRL 4IN 9604848 (GAUZE/BANDAGES/DRESSINGS) ×1 IMPLANT
SUT MNCRL AB 3-0 PS2 27 (SUTURE) ×1 IMPLANT
SUT PROLENE 3 0 PS 2 (SUTURE) ×1 IMPLANT
SWAB CULTURE AMIES ANAERIB BLU (MISCELLANEOUS) IMPLANT
SYR 10ML LL (SYRINGE) ×1 IMPLANT
TIP FAN IRRIG PULSAVAC PLUS (DISPOSABLE) IMPLANT
TRAP FLUID SMOKE EVACUATOR (MISCELLANEOUS) ×1 IMPLANT
WATER STERILE IRR 500ML POUR (IV SOLUTION) ×1 IMPLANT

## 2023-08-16 NOTE — Assessment & Plan Note (Signed)
 Estimated body mass index is 23.95 kg/m as calculated from the following:   Height as of this encounter: 5' 11 (1.803 m).   Weight as of this encounter: 77.9 kg.   - Dietitian consult

## 2023-08-16 NOTE — Assessment & Plan Note (Signed)
 Continue home lisinopril

## 2023-08-16 NOTE — Anesthesia Preprocedure Evaluation (Addendum)
 Anesthesia Evaluation  Patient identified by MRN, date of birth, ID band Patient awake    Reviewed: Allergy & Precautions, NPO status , Patient's Chart, lab work & pertinent test results  History of Anesthesia Complications Negative for: history of anesthetic complications  Airway Mallampati: IV   Neck ROM: Full    Dental no notable dental hx.    Pulmonary neg pulmonary ROS   Pulmonary exam normal breath sounds clear to auscultation       Cardiovascular hypertension, Normal cardiovascular exam Rhythm:Regular Rate:Normal  DVT on Eliquis    ECG 08/15/23:  Sinus rhythm Atrial premature complexes RBBB and LAFB   Neuro/Psych negative neurological ROS     GI/Hepatic negative GI ROS,,,(+) Cirrhosis         Endo/Other  diabetes, Type 2    Renal/GU negative Renal ROS     Musculoskeletal   Abdominal   Peds  Hematology negative hematology ROS (+)   Anesthesia Other Findings   Reproductive/Obstetrics                             Anesthesia Physical Anesthesia Plan  ASA: 3  Anesthesia Plan: General   Post-op Pain Management:    Induction: Intravenous  PONV Risk Score and Plan: 2 and Propofol infusion, TIVA and Treatment may vary due to age or medical condition  Airway Management Planned: Natural Airway  Additional Equipment:   Intra-op Plan:   Post-operative Plan:   Informed Consent: I have reviewed the patients History and Physical, chart, labs and discussed the procedure including the risks, benefits and alternatives for the proposed anesthesia with the patient or authorized representative who has indicated his/her understanding and acceptance.       Plan Discussed with: CRNA  Anesthesia Plan Comments: (LMA/GETA backup discussed.  Patient consented for risks of anesthesia including but not limited to:  - adverse reactions to medications - damage to eyes, teeth, lips or  other oral mucosa - nerve damage due to positioning  - sore throat or hoarseness - damage to heart, brain, nerves, lungs, other parts of body or loss of life  Informed patient about role of CRNA in peri- and intra-operative care.  Patient voiced understanding.)        Anesthesia Quick Evaluation

## 2023-08-16 NOTE — Anesthesia Postprocedure Evaluation (Signed)
 Anesthesia Post Note  Patient: Alejandro Marquez  Procedure(s) Performed: AMPUTATION, TOE (Left: Toe)  Patient location during evaluation: PACU Anesthesia Type: MAC Level of consciousness: awake and alert, oriented and patient cooperative Pain management: pain level controlled Vital Signs Assessment: post-procedure vital signs reviewed and stable Respiratory status: spontaneous breathing, nonlabored ventilation and respiratory function stable Cardiovascular status: blood pressure returned to baseline and stable Postop Assessment: adequate PO intake Anesthetic complications: no   No notable events documented.   Last Vitals:  Vitals:   08/16/23 1330 08/16/23 1345  BP: 111/66 117/70  Pulse: 68 68  Resp: 20 19  Temp:  36.7 C  SpO2: 99% 100%    Last Pain:  Vitals:   08/16/23 1345  TempSrc:   PainSc: 0-No pain                 Alfonso Ruths

## 2023-08-16 NOTE — Progress Notes (Signed)
 Progress Note   Patient: Alejandro Marquez FMW:[id number] DOB: 08/31/1955 DOA: 08/15/2023     1 DOS: the patient was seen and examined on 08/16/2023   Brief hospital course: Partly taken from H&P.   DEANDRAE WAJDA is a 68 y.o. male with medical history significant of DMII , hx of Syncope, mild cirrhosis. DVT unprovoked, Hypertension  Who presents to ED s/p episode of syncope at which time in the field patient was found to be hypoglycemic to the 30's. Patient also has hx of redness and swelling of left lower extremity x 2days.  On presentation vital stable, initial blood glucose of 31 which increased to 121 on subsequent check.  Labs mostly stable.  Chest x-ray negative for any acute abnormality.  Foot x-ray shows severe soft tissue swelling with a small superficial ulceration along the plantar aspect.  MRI of foot with concern of early osteomyelitis.  Podiatry was consulted and patient was started on broad-spectrum antibiotics.  6/26: Vital stable, hyponatremia and hypokalemia resolved.  CBG elevated above 200-discontinuing D10.  Elevated inflammatory markers with ESR of 53 and CRP of 17.1.  A1c of 6.1.  Preliminary blood cultures negative in 24 hours.  Going for left great toe amputation with podiatry later today.  Assessment and Plan: * Osteomyelitis of great toe of left foot (HCC) Patient with concern of left great toe osteomyelitis with surrounding cellulitis. Going to the OR for amputation with podiatry today -Continue with broad-spectrum antibiotics Follow-up for surgical recommendations-  Hypoglycemia Patient admitted with concern of hypoglycemia after having a syncopal episode.  Likely due to taking his home medications without eating or keeping himself well-hydrated.  Apparently was working in the sun also. Hypoglycemia has been resolved. - Stop D10 as blood sugar now above 200  Diabetes mellitus without complication (HCC) A1c of 6.1. - Continue with SSI  Essential  hypertension - Continue home lisinopril   History of DVT (deep vein thrombosis) Holding Eliquis  currently due to surgery. - Should be able to resume from tomorrow  Malnutrition of moderate degree Estimated body mass index is 23.95 kg/m as calculated from the following:   Height as of this encounter: 5' 11 (1.803 m).   Weight as of this encounter: 77.9 kg.   - Dietitian consult      Subjective: Patient was still having significant swelling and redness involving left lower extremity.  Awaiting surgery.  Physical Exam: Vitals:   08/16/23 0300 08/16/23 0901 08/16/23 1016 08/16/23 1128  BP: 104/71 116/71  123/76  Pulse: 70 66  72  Resp: 18 18  18   Temp: 98 F (36.7 C) 98.1 F (36.7 C)  98 F (36.7 C)  TempSrc:      SpO2: 99% 100%  99%  Weight:   77.9 kg 77.9 kg  Height:    5' 11 (1.803 m)   General.  Malnourished gentleman, in no acute distress. Pulmonary.  Lungs clear bilaterally, normal respiratory effort. CV.  Regular rate and rhythm, no JVD, rub or murmur. Abdomen.  Soft, nontender, nondistended, BS positive. CNS.  Alert and oriented .  No focal neurologic deficit. Extremities.  Left lower extremity edema and erythema up to calf, left foot with bandage. Psychiatry.  Judgment and insight appears normal.   Data Reviewed: Prior data reviewed  Family Communication: Discussed with patient  Disposition: Status is: Inpatient Remains inpatient appropriate because: Severity of illness  Planned Discharge Destination: Home with Home Health  DVT prophylaxis.  Eliquis -will start from tomorrow Time spent: 50 minutes  This  record has been created using Conservation officer, historic buildings. Errors have been sought and corrected,but may not always be located. Such creation errors do not reflect on the standard of care.   Author: Amaryllis Dare, MD 08/16/2023 12:51 PM  For on call review [email or web address].

## 2023-08-16 NOTE — Assessment & Plan Note (Signed)
 Patient admitted with concern of hypoglycemia after having a syncopal episode.  Likely due to taking his home medications without eating or keeping himself well-hydrated.  Apparently was working in the sun also. Hypoglycemia has been resolved. - Stop D10 as blood sugar now above 200

## 2023-08-16 NOTE — Progress Notes (Signed)
 Initial Nutrition Assessment  DOCUMENTATION CODES:   Non-severe (moderate) malnutrition in context of social or environmental circumstances  INTERVENTION:   -Obtain new wt -Once diet is advanced, add:   -MVI with minerals daily -500 mg vitamin C BID -220 mg zinc sulfate daily x 14 days -1 packet Juven BID, each packet provides 95 calories, 2.5 grams of protein (collagen), and 9.8 grams of carbohydrate (3 grams sugar); also contains 7 grams of L-arginine and L-glutamine, 300 mg vitamin C, 15 mg vitamin E, 1.2 mcg vitamin B-12, 9.5 mg zinc, 200 mg calcium, and 1.5 g  Calcium Beta-hydroxy-Beta-methylbutyrate to support wound healing  -Ensure Max po daily, each supplement provides 150 kcal and 30 grams of protein -Magic cup TID with meals, each supplement provides 290 kcal and 9 grams of protein   -TOC consult for additional resources  NUTRITION DIAGNOSIS:   Moderate Malnutrition related to social / environmental circumstances as evidenced by moderate fat depletion, moderate muscle depletion, severe muscle depletion.  GOAL:   Patient will meet greater than or equal to 90% of their needs  MONITOR:   PO intake, Supplement acceptance, Diet advancement  REASON FOR ASSESSMENT:   Consult Assessment of nutrition requirement/status  ASSESSMENT:   Pt with medical history significant of DMII , hx of Syncope, mild cirrhosis. DVT unprovoked, Hypertension who presents s/p episode of syncope at which time in the field patient was found to be hypoglycemic to the 30's. Patient also has hx of redness and swelling of left lower extremity for 2 days PTA.  Pt admitted with osteomyelitis of lt great toe.   Reviewed I/O's: -392 ml x 24 hours  UOP: 1.4 L x 24 hours  Case discussed with RN, who confirms that pt is NPO for lt great toe amputation with podiatry.   Spoke with pt at bedside, who was pleasant and in good spirits today. Pt reports feeling better since being admitted. He admits that  the outside heat has impacted him greatly, which is why he suspects contributed to this hospitalization. 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 Pt shares that he has been feeling weak and lightheaded over the past few days. He remembers his co workers helping him sit down, but does not recall events after that. He noted that he was hypoglycemic, which is not something that he typically experiences.   Pt shares that he has noticed swelling in his lower extremities over the past few months and obtained ultrasounds as an outpatient for further work-up. Pt does have neuropathy at baseline and shares that he is clumsy at baseline, but no falls.   Pt reports decreased oral intake over the past few weeks, usually consuming one meals per day in the evening due to extreme heat.   Per pt, he has progressively lost weight over the past several years. He used to weight around 200# and no reports his UBW is around 145#, which he has been maintaining over the past several years. Pt denies any recent wt loss. Based upon exam and weight history, suspect most recent wt is in accurate. RD will obtain new wt to better assess acute weight changes.   Discussed importance of good meal and supplement intake to promote healing. Pt amenable to supplements.   Pt reports that he is anxious about hospital bills and being out of work to recover. Emotional support provided. RD will obtain [facility] consult.   Medications reviewed and  include dextrose 10% infusion @ 75 ml/hr.   Lab Results  Component Value Date   HGBA1C 6.1 (H) 08/15/2023   PTA DM medications are 1000 mg metformin  daily, 10 mg glipizide  BID, and 100 mg januvia  daily.   Labs reviewed: CBGS: 140-197 (inpatient orders for glycemic control are 0-6 units insulin aspart every 4 hours).    NUTRITION - FOCUSED PHYSICAL EXAM:  Flowsheet Row Most Recent Value  Orbital Region Moderate depletion   Upper Arm Region Moderate depletion  Thoracic and Lumbar Region Moderate depletion  Buccal Region Moderate depletion  Temple Region Moderate depletion  Clavicle Bone Region Severe depletion  Clavicle and Acromion Bone Region Severe depletion  Scapular Bone Region Severe depletion  Dorsal Hand Moderate depletion  Patellar Region Moderate depletion  Anterior Thigh Region Moderate depletion  Posterior Calf Region Moderate depletion  Edema (RD Assessment) Mild  Hair Reviewed  Eyes Reviewed  Mouth Reviewed  Skin Reviewed  Nails Reviewed    Diet Order:   Diet Order             Diet NPO time specified  Diet effective midnight                   EDUCATION NEEDS:   Education needs have been addressed  Skin:  Skin Assessment: Skin Integrity Issues: Skin Integrity Issues:: Diabetic Ulcer Diabetic Ulcer: lt great toe  Last BM:  Unknown  Height:   Ht Readings from Last 1 Encounters:  08/15/23 5' 11 (1.803 m)    Weight:   Wt Readings from Last 1 Encounters:  08/15/23 46.2 kg    Ideal Body Weight:  78.2 kg  BMI:  Body mass index is 14.21 kg/m.  Estimated Nutritional Needs:   Kcal:  7849-76949  Protein:  115-130 grams  Fluid:  2.0-2.2 L    Margery ORN, RD, LDN, CDCES Registered Dietitian III Certified Diabetes Care and Education Specialist If unable to reach this RD, please use RD Inpatient group chat on secure chat between hours of 8am-4 pm daily

## 2023-08-16 NOTE — Plan of Care (Signed)
   Problem: Education: Goal: Knowledge of General Education information will improve Description Including pain rating scale, medication(s)/side effects and non-pharmacologic comfort measures Outcome: Progressing

## 2023-08-16 NOTE — Op Note (Signed)
 Full Operative Report  Date of Operation: 1:13 PM, 08/16/2023   Patient: Alejandro Marquez - 68 y.o. male  Surgeon: Malvin Marsa [facility], DPM   Assistant: None  Diagnosis: Osteomyelitis  Procedure:  1.  Amputation left great toe at MPJ level, left foot 2.  Excisional debridement of ulceration to subcutaneous fat tissue level, lateral fifth MPJ, left foot    Anesthesia: General  Mazzoni, Andrea, MD  Anesthesiologist: Shellie Odor, MD CRNA: Jackye Spanner, CRNA   Estimated Blood Loss: 10 mL  Hemostasis: 1) Anatomical dissection, mechanical compression, electrocautery 2) no tourniquet was used in procedure  Implants: * No implants in log *  Materials: Prolene 3-0  Injectables: 1) Pre-operatively: 10 cc of 50:50 mixture 1%lidocaine plain and 0.5% marcaine plain 2) Post-operatively: None   Specimens: - Pathology: Left hallux for pathology - Microbiology: Bone culture distal phalanx left great toe   Antibiotics: IV antibiotics given per schedule on the floor  Drains: None  Complications: Patient tolerated the procedure well without complication.   Operative findings: As below in detailed report  Indications for Procedure: Alejandro Marquez presents to [facility] [place] with a chief complaint of necrotic ulceration with concern for underlying osteomyelitis on MRI of the left great toe distal phalanx.  Soft tissue infection carried back to the MPJ level.  the patient has failed conservative treatments of various modalities. At this time the patient has elected to proceed with surgical correction. All alternatives, risks, and complications of the procedures were thoroughly explained to the patient. Patient exhibits appropriate understanding of all discussion points and informed consent was signed and obtained in the chart with no guarantees to surgical outcome given or implied.  Description of Procedure: Patient was brought to the operating room. Patient  remained on their hospital bed in the supine position. A surgical timeout was performed and all members of the operating room, the procedure, and the surgical site were identified. anesthesia occurred as per anesthesia record. Local anesthetic as previously described was then injected about the operative field in a local infiltrative block.  The operative lower extremity as noted above was then prepped and draped in the usual sterile manner. The following procedure then began.  Attention was directed to the 1st digit on the LEFT foot. A full-thickness incision encompassing the entire digit was made using a #15 blade. Dissection was carried down to bone. The toe was secured with a towel clamp, further dissected in its entirety, and disarticulated at the MPJ and passed to the back table as a gross specimen. This was then labled and sent to pathology. The bone was noted to be soft and eroded, and consistent with osteomyelitis.  A bone culture was harvested with rongeur from the distal phalanx and sent for micro.  All remaining necrotic and devitalized soft tissue structures were visualized and dissected away using sharp and dull dissection. Care was taken to protect all neurovascular structures throughout the dissection. All bleeders were cauterized as necessary. The area was then flushed with copious amounts of sterile saline. Then using the suture materials previously described, the site was closed in anatomic layers and the skin was well approximated under minimal tension.  Attention was then directed some hyperkeratotic tissue that was noted intraoperatively on the lateral aspect of the fifth metatarsal head.  Decision was made to debride this hyperkeratotic tissue with concern for underlying ulceration.  Upon debridement of the overlying hyperkeratotic tissue there was no to be an ulceration present to the subcutaneous fat tissue  layer with some necrotic and fibrotic tissue in wound bed.  Excisional  debridement was performed with 15 blade rongeur excisional removal of any necrotic or fibrotic tissues present.  The wound measured approximately 1 x 1 x 0.2 cm postdebridement wound did not probe to tendon or bone.  There was also irrigated thoroughly cleansed with Betadine and then Xeroform dressing was applied as below  The surgical site was then dressed with Xeroform 4 x 4 Kerlix Ace wrap. The patient tolerated both the procedure and anesthesia well with vital signs stable throughout. The patient was transferred in good condition and all vital signs stable  from the OR to recovery under the discretion of anesthesia.  Condition: Vital signs stable, neurovascular status unchanged from preoperative   Surgical plan:  Expect clean surgical margin, recommend 7 days of p.o. antibiotics on discharge Augmentin and doxycycline.  Patient will be stable for discharge home by tomorrow weightbearing as tolerated in postop shoe follow-up in the office next week Wednesday or Thursday in [place] office, dressing remain clean dry intact until that appointment.  The patient will be weightbearing as tolerated in a postop shoe to the operative limb until further instructed. The dressing is to remain clean, dry, and intact. Will continue to follow unless noted elsewhere.   Marsa Honour, DPM [facility]

## 2023-08-16 NOTE — Assessment & Plan Note (Signed)
 Holding Eliquis  currently due to surgery. - Should be able to resume from tomorrow

## 2023-08-16 NOTE — Hospital Course (Addendum)
 Partly taken from H&P.   Alejandro Marquez is a 68 y.o. male with medical history significant of DMII , hx of Syncope, mild cirrhosis. DVT unprovoked, Hypertension  Who presents to ED s/p episode of syncope at which time in the field patient was found to be hypoglycemic to the 30's. Patient also has hx of redness and swelling of left lower extremity x 2days.  On presentation vital stable, initial blood glucose of 31 which increased to 121 on subsequent check.  Labs mostly stable.  Chest x-ray negative for any acute abnormality.  Foot x-ray shows severe soft tissue swelling with a small superficial ulceration along the plantar aspect.  MRI of foot with concern of early osteomyelitis.  Podiatry was consulted and patient was started on broad-spectrum antibiotics.  6/26: Vital stable, hyponatremia and hypokalemia resolved.  CBG elevated above 200-discontinuing D10.  Elevated inflammatory markers with ESR of 53 and CRP of 17.1.  A1c of 6.1.  Preliminary blood cultures negative in 24 hours.   6/27: Vitals and labs stable.  S/p left great toe amputation and wound debridement at the base of fifth metatarsal.  Preliminary cultures growing gram-positive cocci.  Podiatry cleared him for discharge on 1 week of Augmentin and doxycycline.  PT evaluated him and recommended home health which was ordered.  Patient will follow-up with podiatry closely for initial dressing change and further management.  Podiatry will follow-up on final culture results.  He was given some dietary supplements along with 1 week of antibiotic.  He will continue the rest of his home medications and follow-up with his providers closely for further management.

## 2023-08-16 NOTE — Assessment & Plan Note (Signed)
 Patient with concern of left great toe osteomyelitis with surrounding cellulitis. Going to the OR for amputation with podiatry today -Continue with broad-spectrum antibiotics Follow-up for surgical recommendations-

## 2023-08-16 NOTE — Assessment & Plan Note (Signed)
 A1c of 6.1. - Continue with SSI

## 2023-08-16 NOTE — Progress Notes (Signed)
 History and Physical Interval Note:  08/16/2023 12:25 PM  Alejandro Marquez  has presented today for surgery, with the diagnosis of osteomyelitis left great toe.  The various methods of treatment have been discussed with the patient and family. After consideration of risks, benefits and other options for treatment, the patient has consented to   Procedure(s) with comments: AMPUTATION, TOE (Left) - Great toe or partial first ray as needed for closure purposes as a surgical intervention.  The patient's history has been reviewed, patient examined, no change in status, stable for surgery.  I have reviewed the patient's chart and labs.  Questions were answered to the patient's satisfaction.     Marsa FALCON Parveen Freehling

## 2023-08-16 NOTE — Transfer of Care (Signed)
 Immediate Anesthesia Transfer of Care Note  Patient: Alejandro Marquez  Procedure(s) Performed: AMPUTATION, TOE (Left: Toe)  Patient Location: PACU  Anesthesia Type:General  Level of Consciousness: awake, alert , and drowsy  Airway & Oxygen Therapy: Patient Spontanous Breathing  Post-op Assessment: Report given to RN and Post -op Vital signs reviewed and stable  Post vital signs: Reviewed and stable  Last Vitals:  Vitals Value Taken Time  BP 105/68 08/16/23 13:15  Temp 36.9 C 08/16/23 13:15  Pulse 68 08/16/23 13:18  Resp 17 08/16/23 13:18  SpO2 100 % 08/16/23 13:18  Vitals shown include unfiled device data.  Last Pain:  Vitals:   08/16/23 1315  TempSrc:   PainSc: 0-No pain         Complications: No notable events documented.

## 2023-08-17 ENCOUNTER — Encounter: Payer: Self-pay | Admitting: Podiatry

## 2023-08-17 DIAGNOSIS — M869 Osteomyelitis, unspecified: Secondary | ICD-10-CM | POA: Diagnosis not present

## 2023-08-17 DIAGNOSIS — I10 Essential (primary) hypertension: Secondary | ICD-10-CM | POA: Diagnosis not present

## 2023-08-17 DIAGNOSIS — E119 Type 2 diabetes mellitus without complications: Secondary | ICD-10-CM | POA: Diagnosis not present

## 2023-08-17 DIAGNOSIS — E162 Hypoglycemia, unspecified: Secondary | ICD-10-CM | POA: Diagnosis not present

## 2023-08-17 LAB — CBC
HCT: 38.9 % — ABNORMAL LOW (ref 39.0–52.0)
Hemoglobin: 13.3 g/dL (ref 13.0–17.0)
MCH: 30.7 pg (ref 26.0–34.0)
MCHC: 34.2 g/dL (ref 30.0–36.0)
MCV: 89.8 fL (ref 80.0–100.0)
Platelets: 118 10*3/uL — ABNORMAL LOW (ref 150–400)
RBC: 4.33 MIL/uL (ref 4.22–5.81)
RDW: 13.7 % (ref 11.5–15.5)
WBC: 4.3 10*3/uL (ref 4.0–10.5)
nRBC: 0 % (ref 0.0–0.2)

## 2023-08-17 LAB — BASIC METABOLIC PANEL WITH GFR
Anion gap: 9 (ref 5–15)
BUN: 15 mg/dL (ref 8–23)
CO2: 24 mmol/L (ref 22–32)
Calcium: 8.6 mg/dL — ABNORMAL LOW (ref 8.9–10.3)
Chloride: 105 mmol/L (ref 98–111)
Creatinine, Ser: 0.85 mg/dL (ref 0.61–1.24)
GFR, Estimated: >60 mL/min (ref >60)
Glucose, Bld: 187 mg/dL — ABNORMAL HIGH (ref 70–99)
Potassium: 3.6 mmol/L (ref 3.5–5.1)
Sodium: 138 mmol/L (ref 135–145)

## 2023-08-17 LAB — GLUCOSE, CAPILLARY
Glucose-Capillary: 191 mg/dL — ABNORMAL HIGH (ref 70–99)
Glucose-Capillary: 157 mg/dL — ABNORMAL HIGH (ref 70–99)

## 2023-08-17 MED ORDER — ENSURE MAX PROTEIN PO LIQD: 11.0000 [oz_av] | Freq: Every day | ORAL | 0 refills | Status: AC

## 2023-08-17 MED ORDER — ADULT MULTIVITAMIN W/MINERALS CH: 1.0000 | ORAL_TABLET | Freq: Every day | ORAL | Status: DC

## 2023-08-17 MED ORDER — ZINC SULFATE 220 (50 ZN) MG PO CAPS: 220.0000 mg | ORAL_CAPSULE | Freq: Every day | ORAL | 0 refills | Status: AC

## 2023-08-17 MED ORDER — ACETAMINOPHEN 325 MG PO TABS: 650.0000 mg | ORAL_TABLET | Freq: Four times a day (QID) | ORAL | 0 refills | Status: AC | PRN

## 2023-08-17 MED ORDER — ASCORBIC ACID 500 MG PO TABS: 500.0000 mg | ORAL_TABLET | Freq: Two times a day (BID) | ORAL | 0 refills | Status: AC

## 2023-08-17 MED ORDER — APIXABAN 5 MG PO TABS
5.0000 mg | ORAL_TABLET | Freq: Two times a day (BID) | ORAL | Status: DC
  Administered 2023-08-17: 5 mg via ORAL
  Filled 2023-08-17: qty 1

## 2023-08-17 MED ORDER — JUVEN PO PACK: 1.0000 | PACK | Freq: Two times a day (BID) | ORAL | 0 refills | Status: AC

## 2023-08-17 MED ORDER — AMOXICILLIN-POT CLAVULANATE 875-125 MG PO TABS: 1.0000 | ORAL_TABLET | Freq: Two times a day (BID) | ORAL | 0 refills | Status: AC

## 2023-08-17 MED ORDER — DOXYCYCLINE HYCLATE 100 MG PO TABS: 100.0000 mg | ORAL_TABLET | Freq: Two times a day (BID) | ORAL | 0 refills | Status: AC

## 2023-08-17 MED ORDER — ADULT MULTIVITAMIN W/MINERALS CH: 1.0000 | ORAL_TABLET | Freq: Every day | ORAL | 1 refills | Status: AC

## 2023-08-17 NOTE — Inpatient Diabetes Management (Signed)
 Inpatient Diabetes Program Recommendations  AACE/ADA: New Consensus Statement on Inpatient Glycemic Control   Target Ranges:  Prepandial:   less than 140 mg/dL      Peak postprandial:   less than 180 mg/dL (1-2 hours)      Critically ill patients:  140 - 180 mg/dL    Latest Reference Range & Units 08/17/23 04:00 08/17/23 07:46  Glucose-Capillary 70 - 99 mg/dL 808 (H) 842 (H)    Latest Reference Range & Units 08/16/23 08:22 08/16/23 11:30 08/16/23 13:16 08/16/23 15:23 08/16/23 20:31 08/16/23 23:20  Glucose-Capillary 70 - 99 mg/dL 744 (H) 819 (H) 849 (H) 208 (H) 207 (H) 155 (H)   Review of Glycemic Control  Diabetes history: DM2 Outpatient Diabetes medications: Glipizide  10 mg BID, Metformin  1000 mg BID, Januvia  10 mg daily Current orders for Inpatient glycemic control: Novolog  0-6 units Q4H  Inpatient Diabetes Program Recommendations:    Insulin : May want to consider increasing Novolog  correction to 0-9 units and change frequency of CBGs and Novolog  to AC&HS if patient is eating well.  Thanks, Earnie Gainer, RN, MSN, CDCES Diabetes Coordinator Inpatient Diabetes Program [phone] (Team Pager from 8am to 5pm)

## 2023-08-17 NOTE — Progress Notes (Signed)
  Subjective:  Patient ID: Alejandro Marquez, male    DOB: Jan 03, 1956,  MRN: [id number]  Chief Complaint  Patient presents with   Loss of Consciousness    DOS: 08/16/23 Procedure: 1.  Amputation left great toe at MPJ level, left foot 2.  Excisional debridement of ulceration to subcutaneous fat tissue level, lateral fifth MPJ, left foot  68 y.o. male seen for post op check. He reports he is doing well denies pain. Sitting in chair. Has post op shoe. Discussed plans for follow up next week and to keep dressing dry and intact.  Review of Systems: Negative except as noted in the HPI. Denies N/V/F/Ch.   Objective:   Constitutional Well developed. Well nourished.  Vascular Foot warm and well perfused. Capillary refill normal to all digits.   No calf pain with palpation  Neurologic Normal speech. Oriented to person, place, and time. Epicritic sensation Diminished to toes  Dermatologic Dressing C/D/I to left foot  Orthopedic: S/p L hallux amputation at MPJ level, lateral 5th met head ulcer debridement   Radiographs: Postsurgical changes consistent with interval first digit amputation. No subcutaneous gas or radiopaque foreign body.  Pathology: pending  Micro: FEW GRAM POSITIVE COCCI , pending  Assessment:   1. Hypoglycemia associated with type 2 diabetes mellitus (HCC)   2. Cellulitis of foot, left   3. Syncope and collapse   Osteomyelitis of left hallux s/p Left hallux amputation at MPJ level and lateral 5th metatarsal base ulceration debridement  Plan:  Patient was evaluated and treated and all questions answered.  POD # 1 s/p above procedures -Progressing as expected post operatively. He is doing well today pain controlled. Dressing intact -XR: Expected post op changes -WB Status: WBAT in post op shoe to LLE -Sutures: Remain intact 2-3 weeks. -Medications/ABX: Recommend 7 days doxycycline and Augmentin on discharge. Can adjust abx outpatient as needed -Dressing: To  remain c/d/I until follow up next week Wednesday or Thursday in [place] office - F/u Plan: Pt stable for DC home today from my perspective, will follow up next week in our [place] office with Dr. Silva or Dr. Tobie. Will sign off please msg with questions / concerns.         Marolyn JULIANNA Honour, DPM [facility]

## 2023-08-17 NOTE — Evaluation (Signed)
 Physical Therapy Evaluation Patient Details Name: Alejandro Marquez MRN: [id number] DOB: [birth year] Today's Date: 08/17/2023  History of Present Illness  Alejandro ADVIT TRETHEWEY is a 68 y.o. male s/p day 1 L foot big toe amputation with medical history significant of DMII , hx of Syncope, mild cirrhosis. DVT unprovoked, Hypertension  Who presents to ED s/p episode of syncope at which time in the field patient was found to be hypoglycemic to the 30's. Patient also has hx of redness and swelling of left lower extremity x 2days.  Clinical Impression  Pt is a pleasant 68 year old male who was admitted for osteomyelitis of L great toe . Pt performs bed mobility with CGA, minimal verbal cueing on placement of LE. STS transfer performed with RW, requiring initial verbal cueing on use of DME, no LOB experienced throughout. Ambulation successful with RW and surgical shoe, step through pattern noticed with minimal verbal cueing of RW manipulation, no LOB experienced. Pt demonstrates deficits with general balance, strength, and activity tolerance and pt would benefit from skilled PT interventions to address deficits noted. PT to follow acutely as appropriate.          If plan is discharge home, recommend the following: A little help with walking and/or transfers;A little help with bathing/dressing/bathroom;Assistance with cooking/housework;Assist for transportation;Help with stairs or ramp for entrance   Can travel by private vehicle        Equipment Recommendations BSC/3in1  Recommendations for Other Services       Functional Status Assessment Patient has had a recent decline in their functional status and demonstrates the ability to make significant improvements in function in a reasonable and predictable amount of time.     Precautions / Restrictions Precautions Precautions: Fall Recall of Precautions/Restrictions: Intact Restrictions Weight Bearing Restrictions Per Provider Order: Yes LLE Weight  Bearing Per Provider Order: Weight bearing as tolerated Other Position/Activity Restrictions: surgical shoe L foot when weight bearing      Mobility  Bed Mobility Overal bed mobility: Needs Assistance Bed Mobility: Supine to Sit     Supine to sit: Contact guard     General bed mobility comments: CGA, min verbal cues on LE limb placement    Transfers Overall transfer level: Needs assistance Equipment used: Rolling walker (2 wheels) Transfers: Sit to/from Stand Sit to Stand: Contact guard assist           General transfer comment: CGA, verbal cues initially for manipulation of RW, no complaints of pain when WB    Ambulation/Gait Ambulation/Gait assistance: Contact guard assist Gait Distance (Feet): 200 Feet Assistive device: Rolling walker (2 wheels) Gait Pattern/deviations: Decreased step length - right, Decreased step length - left, Step-through pattern       General Gait Details: use of RW for ambulation, initial verbal cueing on manipulation of RW, no LOB experienced or pain, pt educated on use of RW and shoe when ambulating  Stairs            Wheelchair Mobility     Tilt Bed    Modified Rankin (Stroke Patients Only)       Balance Overall balance assessment: Needs assistance Sitting-balance support: No upper extremity supported, Feet supported Sitting balance-Leahy Scale: Good Sitting balance - Comments: sititng EOB   Standing balance support: Bilateral upper extremity supported Standing balance-Leahy Scale: Good Standing balance comment: use of RW for added UE support when WB on L LE  Pertinent Vitals/Pain Pain Assessment Pain Assessment: No/denies pain    Home Living Family/patient expects to be discharged to:: Private residence Living Arrangements: Non-relatives/Friends Available Help at Discharge: Friend(s) Type of Home: House Home Access: Stairs to enter Entrance Stairs-Rails: None Water quality scientist of Steps: 5   Home Layout: One level Home Equipment: Agricultural consultant (2 wheels) Additional Comments: has a RW at home from daughter, does not currently use it ambulate    Prior Function Prior Level of Function : Independent/Modified Independent             Mobility Comments: does not use AD ADLs Comments: ind     Extremity/Trunk Assessment   Upper Extremity Assessment Upper Extremity Assessment: Overall WFL for tasks assessed    Lower Extremity Assessment Lower Extremity Assessment: Overall WFL for tasks assessed    Cervical / Trunk Assessment Cervical / Trunk Assessment: Kyphotic  Communication   Communication Communication: No apparent difficulties    Cognition Arousal: Alert Behavior During Therapy: WFL for tasks assessed/performed   PT - Cognitive impairments: No apparent impairments                         Following commands: Intact       Cueing Cueing Techniques: Verbal cues, Tactile cues     General Comments      Exercises     Assessment/Plan    PT Assessment Patient needs continued PT services  PT Problem List Decreased activity tolerance;Decreased balance;Decreased mobility;Decreased knowledge of use of DME       PT Treatment Interventions DME instruction;Gait training;Stair training;Functional mobility training;Therapeutic activities;Therapeutic exercise;Balance training;Patient/family education    PT Goals (Current goals can be found in the Care Plan section)  Acute Rehab PT Goals Patient Stated Goal: to go home and get better PT Goal Formulation: With patient Time For Goal Achievement: 08/31/23 Potential to Achieve Goals: Good    Frequency Min 1X/week     Co-evaluation               AM-PAC PT 6 Clicks Mobility  Outcome Measure Help needed turning from your back to your side while in a flat bed without using bedrails?: None Help needed moving from lying on your back to sitting on the side of a flat  bed without using bedrails?: A Little Help needed moving to and from a bed to a chair (including a wheelchair)?: A Little Help needed standing up from a chair using your arms (e.g., wheelchair or bedside chair)?: A Little Help needed to walk in hospital room?: A Little Help needed climbing 3-5 steps with a railing? : A Little 6 Click Score: 19    End of Session   Activity Tolerance: Patient tolerated treatment well Patient left: in chair;with call bell/phone within reach;with chair alarm set Nurse Communication: Mobility status PT Visit Diagnosis: Unsteadiness on feet (R26.81);Repeated falls (R29.6);Other abnormalities of gait and mobility (R26.89)    Time: 9092-9058 PT Time Calculation (min) (ACUTE ONLY): 34 min   Charges:   PT Evaluation $PT Eval Moderate Complexity: 1 Mod PT Treatments $Gait Training: 8-22 mins PT General Charges $$ ACUTE PT VISIT: 1 Visit           Thula Stewart Romero-Perozo, SPT  08/17/2023, 11:45 AM

## 2023-08-17 NOTE — Progress Notes (Signed)
 Patient is not able to walk the distance required to go the bathroom, or he/she is unable to safely negotiate stairs required to access the bathroom.  A BSC will alleviate this problem.

## 2023-08-17 NOTE — TOC Initial Note (Addendum)
 Transition of Care [facility]) - Initial/Assessment Note    Patient Details  Name: Alejandro Marquez MRN: [id number] Date of Birth: Apr 13, 1955  Transition of Care [facility]) CM/SW Contact:    Halee Glynn C Curley [place], RN Phone Number: 08/17/2023, 11:54 AM  Clinical Narrative:                 Patient is agreeable to [facility] PT and does not have a choice of an agency. Patient advised the accepting agency will contact her directly to scheduled SOC within 48 post discharge. Patient is agreeable to receive BSC. He is also request a RW with 2 wheels.   Request for DME sent to [facility] from [facility].   Referral for [facility] sent to declined Shaun from [facility]. No PT available  Per [name]  at [facility] PT unavailable at this time.   Referral sent to [facility] from [facility] will accept referral. Patient likely to have high copay.     TOC signing off.          Patient Goals and CMS Choice            Expected Discharge Plan and Services         Expected Discharge Date: 08/17/23                                    Prior Living Arrangements/Services                       Activities of Daily Living   ADL Screening (condition at time of admission) Independently performs ADLs?: Yes (appropriate for developmental age) Is the patient deaf or have difficulty hearing?: No Does the patient have difficulty seeing, even when wearing glasses/contacts?: No Does the patient have difficulty concentrating, remembering, or making decisions?: No  Permission Sought/Granted                  Emotional Assessment              Admission diagnosis:  Syncope and collapse [R55] Hypoglycemia [E16.2] Cellulitis of foot, left [L03.116] Hypoglycemia associated with type 2 diabetes mellitus (HCC) [E11.649] Patient Active Problem List   Diagnosis Date Noted   Malnutrition of moderate degree 08/16/2023   History of DVT (deep vein thrombosis) 08/16/2023   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)  08/16/2023   Osteomyelitis of great toe of left foot (HCC) 08/15/2023   Hypoglycemia 08/15/2023   Acute deep vein thrombosis (DVT) of popliteal vein of right lower extremity (HCC) 04/19/2023   Liver disease, chronic 07/14/2022   Essential hypertension 03/31/2020   Hyperlipidemia associated with type 2 diabetes mellitus (HCC) 03/31/2020   Rosacea 03/21/2018   Elevated transaminase level 06/19/2016   Diabetes mellitus without complication (HCC) 07/07/2014   ED (erectile dysfunction) of organic origin 07/07/2014   PCP:  Gasper Nancyann BRAVO, MD Pharmacy:   [facility] [address], [place] - [address] [place] [address] Phone: [phone] Fax: [phone]     Social Drivers of Health (SDOH) Social History: SDOH Screenings   Food Insecurity: No Food Insecurity (08/15/2023)  Housing: Low Risk  (08/15/2023)  Transportation Needs: No Transportation Needs (08/15/2023)  Utilities: Not At Risk (08/15/2023)  Alcohol Screen: Low Risk  (07/14/2022)  Depression (PHQ2-9): Low Risk  (03/16/2023)  Financial Resource Strain: Low Risk  (06/29/2023)  Social Connections: Socially Isolated (08/15/2023)  Stress: No Stress Concern  Present (06/29/2023)  Tobacco Use: Low Risk  (08/16/2023)  Health Literacy: Adequate Health Literacy (06/29/2023)   SDOH Interventions:     Readmission Risk Interventions     No data to display

## 2023-08-17 NOTE — Discharge Summary (Signed)
 Physician Discharge Summary   Patient: Alejandro Marquez MRN: [id number] DOB: Aug 03, 1955  Admit date:     08/15/2023  Discharge date: 08/17/23  Discharge Physician: Amaryllis Dare   PCP: Gasper Nancyann BRAVO, MD   Recommendations at discharge:  Please obtain CBC and BMP on follow-up Follow-up with podiatry Follow-up with primary care provider Please follow-up on final intraoperative wound culture results and adjust antibiotics accordingly.  Discharge Diagnoses: Principal Problem:   Osteomyelitis of great toe of left foot (HCC) Active Problems:   Hypoglycemia   Diabetes mellitus without complication (HCC)   Essential hypertension   History of DVT (deep vein thrombosis)   Malnutrition of moderate degree   Ulcer of left foot with fat layer exposed [facility])   Hospital Course: Partly taken from H&P.   Alejandro Marquez is a 68 y.o. male with medical history significant of DMII , hx of Syncope, mild cirrhosis. DVT unprovoked, Hypertension  Who presents to ED s/p episode of syncope at which time in the field patient was found to be hypoglycemic to the 30's. Patient also has hx of redness and swelling of left lower extremity x 2days.  On presentation vital stable, initial blood glucose of 31 which increased to 121 on subsequent check.  Labs mostly stable.  Chest x-ray negative for any acute abnormality.  Foot x-ray shows severe soft tissue swelling with a small superficial ulceration along the plantar aspect.  MRI of foot with concern of early osteomyelitis.  Podiatry was consulted and patient was started on broad-spectrum antibiotics.  6/26: Vital stable, hyponatremia and hypokalemia resolved.  CBG elevated above 200-discontinuing D10.  Elevated inflammatory markers with ESR of 53 and CRP of 17.1.  A1c of 6.1.  Preliminary blood cultures negative in 24 hours.   6/27: Vitals and labs stable.  S/p left great toe amputation and wound debridement at the base of fifth metatarsal.  Preliminary  cultures growing gram-positive cocci.  Podiatry cleared him for discharge on 1 week of Augmentin and doxycycline.  PT evaluated him and recommended home health which was ordered.  Patient will follow-up with podiatry closely for initial dressing change and further management.  Podiatry will follow-up on final culture results.  He was given some dietary supplements along with 1 week of antibiotic.  He will continue the rest of his home medications and follow-up with his providers closely for further management.  Assessment and Plan: * Osteomyelitis of great toe of left foot (HCC) Patient with concern of left great toe osteomyelitis with surrounding cellulitis. S/p left great toe amputation and debridement of wound on fifth metatarsal plantar surface.  Preliminary cultures growing gram-positive cocci.  PT is recommending home health.  Podiatry saw him and cleared for discharge on Augmentin and doxycycline and they will follow-up as outpatient.  Hypoglycemia Patient admitted with concern of hypoglycemia after having a syncopal episode.  Likely due to taking his home medications without eating or keeping himself well-hydrated.  Apparently was working in the sun also. Hypoglycemia has been resolved. - Patient will continue home medication and follow-up with primary care provider for further assistance  Diabetes mellitus without complication (HCC) A1c of 6.1. - Continue with SSI  Essential hypertension - Continue home lisinopril   History of DVT (deep vein thrombosis) Initially held for procedure and he will resume today.  Malnutrition of moderate degree Estimated body mass index is 23.95 kg/m as calculated from the following:   Height as of this encounter: 5' 11 (1.803 m).   Weight as of this  encounter: 77.9 kg.   - Dietitian consult - Patient was given some dietary supplements which she will continue at home      Consultants: Podiatry Procedures performed: Left great toe amputation  and wound debridement on plantar surface of fifth metatarsal Disposition: Home health Diet recommendation:  Discharge Diet Orders (From admission, onward)     Start     Ordered   08/17/23 0000  Diet - low sodium heart healthy        08/17/23 1055           Cardiac and Carb modified diet DISCHARGE MEDICATION: Allergies as of 08/17/2023   No Known Allergies      Medication List     STOP taking these medications    clobetasol  ointment 0.05 % Commonly known as: TEMOVATE    sildenafil  50 MG tablet Commonly known as: Viagra        TAKE these medications    acetaminophen  325 MG tablet Commonly known as: TYLENOL  Take 2 tablets (650 mg total) by mouth every 6 (six) hours as needed for mild pain (pain score 1-3) or fever (or Fever >/= 101).   amoxicillin-clavulanate 875-125 MG tablet Commonly known as: AUGMENTIN Take 1 tablet by mouth 2 (two) times daily for 7 days.   apixaban  5 MG Tabs tablet Commonly known as: Eliquis  Take 1 tablet (5 mg total) by mouth 2 (two) times daily. Start taking after completion of starter pack.   ascorbic acid  500 MG tablet Commonly known as: VITAMIN C  Take 1 tablet (500 mg total) by mouth 2 (two) times daily.   doxycycline 100 MG tablet Commonly known as: VIBRA-TABS Take 1 tablet (100 mg total) by mouth 2 (two) times daily for 7 days.   glipiZIDE  10 MG tablet Commonly known as: GLUCOTROL  TAKE 1 TABLET BY MOUTH TWICE A DAY BEFORE MEALS   lisinopril  10 MG tablet Commonly known as: ZESTRIL  TAKE 1 TABLET BY MOUTH EVERY DAY   metFORMIN  1000 MG tablet Commonly known as: GLUCOPHAGE  TAKE 1 TABLET (1,000 MG TOTAL) BY MOUTH TWICE A DAY WITH FOOD   multivitamin with minerals Tabs tablet Take 1 tablet by mouth daily. Start taking on: August 18, 2023   nutrition supplement (JUVEN) Pack Take 1 packet by mouth 2 (two) times daily between meals.   Ensure Max Protein Liqd Take 330 mLs (11 oz total) by mouth at bedtime.   sitaGLIPtin  100 MG  tablet Commonly known as: Januvia  Take 1 tablet (100 mg total) by mouth daily. TAKE 1 TABLET BY MOUTH EVERY DAY   zinc  sulfate (50mg  elemental zinc ) 220 (50 Zn) MG capsule Take 1 capsule (220 mg total) by mouth daily for 14 days. Start taking on: August 18, 2023               Discharge Care Instructions  (From admission, onward)           Start     Ordered   08/17/23 0000  Leave dressing on - Keep it clean, dry, and intact until clinic visit        08/17/23 1055            Follow-up Information     Tobie Franky SQUIBB, DPM Follow up.   Specialty: Podiatry Why: Have patient call back to reset appointment. Contact information: [address] [place] [address] [phone]                Discharge Exam: Filed Weights   08/15/23 1100 08/16/23 1016 08/16/23 1128  Weight: 46.2 kg 77.9 kg 77.9 kg   General.  Malnourished gentleman, in no acute distress. Pulmonary.  Lungs clear bilaterally, normal respiratory effort. CV.  Regular rate and rhythm, no JVD, rub or murmur. Abdomen.  Soft, nontender, nondistended, BS positive. CNS.  Alert and oriented .  No focal neurologic deficit. Extremities.  No edema, pulses intact and symmetrical.  Left foot with bandage and surgical boot. Psychiatry.  Judgment and insight appears normal.   Condition at discharge: stable  The results of significant diagnostics from this hospitalization (including imaging, microbiology, ancillary and laboratory) are listed below for reference.   Imaging Studies: DG Foot 2 Views Left Result Date: 08/16/2023 CLINICAL DATA:  [id number] Post-operative state 252351 EXAM: LEFT FOOT - 2 VIEW COMPARISON:  August 15, 2023 FINDINGS: No acute fracture or dislocation. Postsurgical changes consistent with an interval first digit amputation. Subcutaneous edema without subcutaneous gas or radiopaque foreign body. Undersurface calcaneal heel spur. Achilles insertion enthesophyte. IMPRESSION: Postsurgical  changes consistent with interval first digit amputation. No subcutaneous gas or radiopaque foreign body. Electronically Signed   By: Rogelia Myers M.D.   On: 08/16/2023 14:07   DG MINI C-ARM IMAGE ONLY Result Date: 08/16/2023 There is no interpretation for this exam.  This order is for images obtained during a surgical procedure.  Please See Surgeries Tab for more information regarding the procedure.   MR TOES LEFT W WO CONTRAST Result Date: 08/15/2023 CLINICAL DATA:  Left great toe ulcer and discoloration EXAM: MRI OF THE LEFT TOES WITHOUT AND WITH CONTRAST TECHNIQUE: Multiplanar, multisequence MR imaging of the left toes was performed both before and after administration of intravenous contrast. CONTRAST:  7mL GADAVIST  GADOBUTROL  1 MMOL/ML IV SOLN COMPARISON:  Radiographs 08/15/2023 FINDINGS: Bones/Joint/Cartilage Diffuse low-level edema in the distal phalanx great toe as on image 10 series 7, favoring early osteomyelitis. No bony destructive findings. Erosive arthropathy laterally at the proximal interphalangeal joint of the fourth toe involving the head of the proximal phalanx and base of the middle phalanx as shown on image 18 series 5. Low-level marrow edema both bones. Osteomyelitis favored given the appearance on images 18-19 of series 5, correlate with any drainage or wound. Erosive arthropathy is a differential diagnostic consideration. Ligaments Lisfranc ligament intact. Muscles and Tendons Regional muscular atrophy. Soft tissues Dorsal subcutaneous edema in the forefoot extends into the toes. Reduced T1 signal in the subcutaneous tissues lateral to the fourth proximal interphalangeal joint, probably from infection/inflammation. Wound along the medial and plantar aspect of the great toe favoring ulceration. IMPRESSION: 1. Wound along the medial and plantar aspect of the great toe favoring ulceration. Diffuse low-level edema in the distal phalanx great toe favoring early osteomyelitis. 2. Erosive  arthropathy laterally at the proximal interphalangeal joint of the fourth toe involving the head of the proximal phalanx and base of the middle phalanx. Low-level marrow edema both bones. Osteomyelitis favored, correlate with any drainage or wound. Erosive arthropathy is a differential diagnostic consideration. 3. Dorsal subcutaneous edema in the forefoot extends into the toes. Reduced T1 signal in the subcutaneous tissues lateral to the fourth proximal interphalangeal joint, probably from infection/inflammation. 4. Regional muscular atrophy. Electronically Signed   By: Ryan Salvage M.D.   On: 08/15/2023 13:35   DG Foot Complete Left Result Date: 08/15/2023 CLINICAL DATA:  Left foot cellulitis. EXAM: LEFT FOOT - COMPLETE 3+ VIEW COMPARISON:  None Available. FINDINGS: There is no evidence of fracture or dislocation. A large plantar calcaneal spur is present. Mild degenerative changes are present  along the dorsal aspect of the mid left foot. Moderate severity soft tissue swelling is seen involving the left great toe. A small (approximately 5 mm) superficial soft tissue ulceration is also seen along the plantar aspect of the left great toe. IMPRESSION: Moderate severity soft tissue swelling involving the left great toe with a small superficial soft tissue ulceration along the plantar aspect of the left great toe. Electronically Signed   By: Suzen Dials M.D.   On: 08/15/2023 12:19   DG Chest Portable 1 View Result Date: 08/15/2023 CLINICAL DATA:  Syncopal episode. EXAM: PORTABLE CHEST 1 VIEW COMPARISON:  June 13, 2017 FINDINGS: The heart size and mediastinal contours are within normal limits. No acute infiltrate, pleural effusion or pneumothorax is identified. Multilevel degenerative changes seen throughout the thoracic spine. IMPRESSION: No active cardiopulmonary disease. Electronically Signed   By: Suzen Dials M.D.   On: 08/15/2023 12:17    Microbiology: Results for orders placed or  performed during the hospital encounter of 08/15/23  Culture, blood (Routine x 2)     Status: None (Preliminary result)   Collection Time: 08/15/23 11:19 AM   Specimen: BLOOD  Result Value Ref Range Status   Specimen Description BLOOD RIGHT FA  Final   Special Requests   Final    BOTTLES DRAWN AEROBIC AND ANAEROBIC Blood Culture results may not be optimal due to an inadequate volume of blood received in culture bottles   Culture   Final    NO GROWTH 2 DAYS Performed at [facility], [address], [place], [place] [address]    Report Status PENDING  Incomplete  Culture, blood (Routine x 2)     Status: None (Preliminary result)   Collection Time: 08/15/23 11:19 AM   Specimen: BLOOD  Result Value Ref Range Status   Specimen Description BLOOD LEFT ANTECUBITAL  Final   Special Requests   Final    BOTTLES DRAWN AEROBIC AND ANAEROBIC Blood Culture adequate volume   Culture   Final    NO GROWTH 2 DAYS Performed at [facility], [address] [place], [place] [address]    Report Status PENDING  Incomplete  Aerobic/Anaerobic Culture w Gram Stain (surgical/deep wound)     Status: None (Preliminary result)   Collection Time: 08/16/23 12:51 PM   Specimen: Toe, Left; Amputation  Result Value Ref Range Status   Specimen Description   Final    TISSUE Performed at [facility], [address], [place], [place] [address]    Special Requests   Final    LEFT TOE Performed at [facility], [address] [place], [place] [address]    Gram Stain   Final    RARE WBC PRESENT, PREDOMINANTLY PMN FEW GRAM POSITIVE COCCI    Culture   Final    CULTURE REINCUBATED FOR BETTER GROWTH Performed at [facility], [address], [place], [place] [address]    Report Status PENDING  Incomplete    Labs: CBC: Recent Labs  Lab 08/15/23 1100 08/17/23 0432  WBC 6.6 4.3  HGB 13.6 13.3  HCT 39.4 38.9*  MCV 88.3 89.8  PLT 104* 118*   Basic Metabolic  Panel: Recent Labs  Lab 08/15/23 1100 08/15/23 2109 08/16/23 0348 08/17/23 0432  NA 132* 132* 135 138  K 2.9* 3.7 3.8 3.6  CL 99 103 105 105  CO2 24 23 24 24   GLUCOSE 104* 274* 217* 187*  BUN 29* 20 17 15   CREATININE 1.23 1.12  0.99 0.85  CALCIUM 8.5* 8.1* 8.3* 8.6*   Liver Function Tests: Recent Labs  Lab 08/15/23 1100  AST 38  ALT 30  ALKPHOS 49  BILITOT 1.7*  PROT 7.5  ALBUMIN 3.3*   CBG: Recent Labs  Lab 08/16/23 1523 08/16/23 2031 08/16/23 2320 08/17/23 0400 08/17/23 0746  GLUCAP 208* 207* 155* 191* 157*    Discharge time spent: greater than 30 minutes.  This record has been created using Conservation officer, historic buildings. Errors have been sought and corrected,but may not always be located. Such creation errors do not reflect on the standard of care.   Signed: Amaryllis Dare, MD [facility] 08/17/2023

## 2023-08-20 ENCOUNTER — Telehealth: Payer: Self-pay

## 2023-08-20 LAB — CULTURE, BLOOD (ROUTINE X 2)
Culture: NO GROWTH
Culture: NO GROWTH
Special Requests: ADEQUATE

## 2023-08-20 LAB — SURGICAL PATHOLOGY

## 2023-08-20 NOTE — Telephone Encounter (Signed)
 Maybe he is in the doughnut hole. He needs to talk to his pharmacy and see why the copay changed.

## 2023-08-20 NOTE — Transitions of Care (Post Inpatient/ED Visit) (Signed)
   08/20/2023  Name: Alejandro Marquez MRN: [id number] DOB: 1955-06-03  Today's TOC FU Call Status: Today's TOC FU Call Status:: Unsuccessful Call (1st Attempt) Unsuccessful Call (1st Attempt) Date: 08/20/23  Attempted to reach the patient regarding the most recent Inpatient/ED visit. Called number provided x 2 and hung up [unable to leave a voicemail message].  Follow Up Plan: Additional outreach attempts will be made to reach the patient to complete the Transitions of Care (Post Inpatient/ED visit) call.   Richerd Fish, RN, BSN, CCM [facility], [facility] RN Care Manager Direct Dial: [phone]

## 2023-08-20 NOTE — Telephone Encounter (Signed)
 Copied from CRM [id number]. Topic: Clinical - Prescription Issue >> Aug 20, 2023  9:28 AM Selinda RAMAN wrote: Reason for CRM: The patient called in stating he was getting 3 bottles of the sitaGLIPtin  (JANUVIA ) 100 MG tablet but when he recently went to his pharmacy to pick up more refills he was told it would now cost him $600 which he cannot afford. He does not understand this and will contact his insurance company but also wanted his provider to know. Please assist patient further

## 2023-08-21 ENCOUNTER — Telehealth: Payer: Self-pay

## 2023-08-21 NOTE — Transitions of Care (Post Inpatient/ED Visit) (Signed)
   08/21/2023  Name: Alejandro Marquez MRN: [id number] DOB: 05-Jan-1956  Today's TOC FU Call Status: Today's TOC FU Call Status:: Unsuccessful Call (2nd Attempt) Unsuccessful Call (2nd Attempt) Date: 08/21/23  Attempted to reach the patient regarding the most recent Inpatient/ED visit. Left a HIPAA approved voicemail message to phone number provided in demographics.   Follow Up Plan: Additional outreach attempts will be made to reach the patient to complete the Transitions of Care (Post Inpatient/ED visit) call.   Richerd Fish, RN, BSN, CCM [facility], [facility] RN Care Manager Direct Dial: [phone]

## 2023-08-22 ENCOUNTER — Telehealth: Payer: Self-pay

## 2023-08-22 NOTE — Transitions of Care (Post Inpatient/ED Visit) (Signed)
   08/22/2023  Name: Alejandro Marquez MRN: [id number] DOB: [birth year]  Today's TOC FU Call Status: Today's TOC FU Call Status:: Unsuccessful Call (3rd Attempt) Unsuccessful Call (3rd Attempt) Date: 08/22/23  Attempted to reach the patient regarding the most recent Inpatient/ED visit. Call made but hung up and unable to leave voicemail message.  Follow Up Plan: No further outreach attempts will be made at this time. We have been unable to contact the patient.  Richerd Fish, RN, BSN, CCM [facility], [facility] RN Care Manager Direct Dial: [phone]

## 2023-08-23 ENCOUNTER — Ambulatory Visit (INDEPENDENT_AMBULATORY_CARE_PROVIDER_SITE_OTHER): Admitting: Podiatry

## 2023-08-23 DIAGNOSIS — Z89412 Acquired absence of left great toe: Secondary | ICD-10-CM

## 2023-08-23 LAB — AEROBIC/ANAEROBIC CULTURE W GRAM STAIN (SURGICAL/DEEP WOUND)

## 2023-08-23 NOTE — Progress Notes (Signed)
 Subjective:  Patient ID: Alejandro Marquez, male    DOB: 05/23/1955,  MRN: [id number]  Chief Complaint  Patient presents with   Routine Post Op    DOS: 08/16/23 Procedure: Left great toe amputation  68 y.o. male returns for post-op check.  Patient states is doing well denies any other acute complaints bandages clean dry and intact no pain.  Ambulating with surgical shoe Dr. Malvin to the surgery  Review of Systems: Negative except as noted in the HPI. Denies N/V/F/Ch.  Past Medical History:  Diagnosis Date   Diabetes mellitus without complication (HCC)    Syncope     Current Outpatient Medications:    acetaminophen  (TYLENOL ) 325 MG tablet, Take 2 tablets (650 mg total) by mouth every 6 (six) hours as needed for mild pain (pain score 1-3) or fever (or Fever >/= 101)., Disp: 90 tablet, Rfl: 0   amoxicillin-clavulanate (AUGMENTIN) 875-125 MG tablet, Take 1 tablet by mouth 2 (two) times daily for 7 days., Disp: 14 tablet, Rfl: 0   apixaban  (ELIQUIS ) 5 MG TABS tablet, Take 1 tablet (5 mg total) by mouth 2 (two) times daily. Start taking after completion of starter pack., Disp: 60 tablet, Rfl: 5   ascorbic acid  (VITAMIN C ) 500 MG tablet, Take 1 tablet (500 mg total) by mouth 2 (two) times daily., Disp: 90 tablet, Rfl: 0   doxycycline (VIBRA-TABS) 100 MG tablet, Take 1 tablet (100 mg total) by mouth 2 (two) times daily for 7 days., Disp: 14 tablet, Rfl: 0   Ensure Max Protein (ENSURE MAX PROTEIN) LIQD, Take 330 mLs (11 oz total) by mouth at bedtime., Disp: 20000 mL, Rfl: 0   glipiZIDE  (GLUCOTROL ) 10 MG tablet, TAKE 1 TABLET BY MOUTH TWICE A DAY BEFORE MEALS, Disp: 180 tablet, Rfl: 1   lisinopril  (ZESTRIL ) 10 MG tablet, TAKE 1 TABLET BY MOUTH EVERY DAY, Disp: 90 tablet, Rfl: 1   metFORMIN  (GLUCOPHAGE ) 1000 MG tablet, TAKE 1 TABLET (1,000 MG TOTAL) BY MOUTH TWICE A DAY WITH FOOD, Disp: 180 tablet, Rfl: 3   Multiple Vitamin (MULTIVITAMIN WITH MINERALS) TABS tablet, Take 1 tablet by mouth  daily., Disp: 90 tablet, Rfl: 1   nutrition supplement, JUVEN, (JUVEN) PACK, Take 1 packet by mouth 2 (two) times daily between meals., Disp: 180 packet, Rfl: 0   sitaGLIPtin  (JANUVIA ) 100 MG tablet, Take 1 tablet (100 mg total) by mouth daily. TAKE 1 TABLET BY MOUTH EVERY DAY, Disp: 90 tablet, Rfl: 1   zinc  sulfate, 50mg  elemental zinc , 220 (50 Zn) MG capsule, Take 1 capsule (220 mg total) by mouth daily for 14 days., Disp: 14 capsule, Rfl: 0  Social History   Tobacco Use  Smoking Status Never  Smokeless Tobacco Never    No Known Allergies Objective:  There were no vitals filed for this visit. There is no height or weight on file to calculate BMI. Constitutional Well developed. Well nourished.  Vascular Foot warm and well perfused. Capillary refill normal to all digits.   Neurologic Normal speech. Oriented to person, place, and time. Epicritic sensation to light touch grossly present bilaterally.  Dermatologic Skin healing well without signs of infection. Skin edges well coapted without signs of infection.  Orthopedic: Tenderness to palpation noted about the surgical site.   Radiographs: None Assessment:   1. History of amputation of left great toe [facility])    Plan:  Patient was evaluated and treated and all questions answered.  S/p foot surgery left -Progressing as expected post-operatively. -XR: None -WB Status:  Weightbearing as tolerated in surgical shoe -Sutures: Intact.  No clinical signs of dehiscence noted no complication noted -Medications: None -Foot redressed.  No follow-ups on file.

## 2023-08-27 ENCOUNTER — Other Ambulatory Visit: Payer: Self-pay | Admitting: Family Medicine

## 2023-08-27 DIAGNOSIS — E119 Type 2 diabetes mellitus without complications: Secondary | ICD-10-CM

## 2023-08-27 NOTE — Telephone Encounter (Signed)
Pt requesting 90 day scripts

## 2023-08-27 NOTE — Telephone Encounter (Unsigned)
 Copied from CRM [id number]. Topic: Clinical - Medication Refill >> Aug 27, 2023  1:18 PM Donee H wrote: Medication: apixaban  (ELIQUIS ) 5 MG TABS tablet  sitaGLIPtin  (JANUVIA ) 100 MG tablet  Patient is requesting for Rx to be for 90 day supply. He stated this is the only way it will get filled at pharmacy   Has the patient contacted their pharmacy? Yes, pharmacy advised patient to reach out to provider to request a 90 day supply in order for insurance to cover  (Agent: If no, request that the patient contact the pharmacy for the refill. If patient does not wish to contact the pharmacy document the reason why and proceed with request.) (Agent: If yes, when and what did the pharmacy advise?)  This is the patient's preferred pharmacy:  [facility] [address] [place] - [address] [place] [address] Phone: [phone] Fax: [phone]  Is this the correct pharmacy for this prescription? Yes If no, delete pharmacy and type the correct one.   Has the prescription been filled recently? No  Is the patient out of the medication? Yes, patient is completely out of apixaban  (ELIQUIS ) 5 MG TABS tablet   Has the patient been seen for an appointment in the last year OR does the patient have an upcoming appointment? Yes  Can we respond through MyChart? No  Agent: Please be advised that Rx refills may take up to 3 business days. We ask that you follow-up with your pharmacy.

## 2023-08-29 MED ORDER — SITAGLIPTIN PHOSPHATE 100 MG PO TABS: 100.0000 mg | ORAL_TABLET | Freq: Every day | ORAL | 1 refills | Status: AC

## 2023-08-29 MED ORDER — APIXABAN 5 MG PO TABS: 5.0000 mg | ORAL_TABLET | Freq: Two times a day (BID) | ORAL | 5 refills | Status: DC

## 2023-08-29 NOTE — Telephone Encounter (Signed)
 Requested medication (s) are due for refill today: yes  Requested medication (s) are on the active medication list: yes  Last refill:  04/30/23  Future visit scheduled: yes  Notes to clinic:  Unable to refill per protocol, last refill by another provider. Requesting 90 day supply.     Requested Prescriptions  Pending Prescriptions Disp Refills   apixaban  (ELIQUIS ) 5 MG TABS tablet 60 tablet 5    Sig: Take 1 tablet (5 mg total) by mouth 2 (two) times daily. Start taking after completion of starter pack.     Hematology:  Anticoagulants - apixaban  Failed - 08/29/2023  1:58 PM      Failed - PLT in normal range and within 360 days    Platelets  Date Value Ref Range Status  08/17/2023 118 (L) 150 - 400 K/uL Final  11/21/2022 89 (LL) 150 - 450 x10E3/uL Final    Comment:    Actual platelet count may be somewhat higher than reported due to aggregation of platelets in this sample.          Failed - HCT in normal range and within 360 days    HCT  Date Value Ref Range Status  08/17/2023 38.9 (L) 39.0 - 52.0 % Final   Hematocrit  Date Value Ref Range Status  11/21/2022 42.9 37.5 - 51.0 % Final         Passed - HGB in normal range and within 360 days    Hemoglobin  Date Value Ref Range Status  08/17/2023 13.3 13.0 - 17.0 g/dL Final  89/98/7975 85.3 13.0 - 17.7 g/dL Final         Passed - Cr in normal range and within 360 days    Creat  Date Value Ref Range Status  01/12/2017 0.83 0.70 - 1.33 mg/dL Final    Comment:    For patients >55 years of age, the reference limit for Creatinine is approximately 13% higher for people identified as African-American. .    Creatinine, Ser  Date Value Ref Range Status  08/17/2023 0.85 0.61 - 1.24 mg/dL Final         Passed - AST in normal range and within 360 days    AST  Date Value Ref Range Status  08/15/2023 38 15 - 41 U/L Final         Passed - ALT in normal range and within 360 days    ALT  Date Value Ref Range Status   08/15/2023 30 0 - 44 U/L Final   ALT (SGPT) P5P  Date Value Ref Range Status  06/06/2022 41 0 - 55 IU/L Final         Passed - Valid encounter within last 12 months    Recent Outpatient Visits           2 months ago Type 2 diabetes mellitus without complication, without long-term current use of insulin  (HCC)   [place] [facility] [place] Nancyann BRAVO, MD   4 months ago Influenza vaccine needed   [facility] [place], [place], PA-C   4 months ago Cellulitis of left lower extremity   [place] [place], [place], PA-C   5 months ago Cellulitis of left lower extremity   [facility] [place] Nancyann BRAVO, MD               sitaGLIPtin  (JANUVIA ) 100 MG tablet 90 tablet 1    Sig: Take 1 tablet (100 mg total)  by mouth daily. TAKE 1 TABLET BY MOUTH EVERY DAY     Endocrinology:  Diabetes - DPP-4 Inhibitors Passed - 08/29/2023  1:58 PM      Passed - HBA1C is between 0 and 7.9 and within 180 days    Hgb A1c MFr Bld  Date Value Ref Range Status  08/15/2023 6.1 (H) 4.8 - 5.6 % Final    Comment:    (NOTE) Diagnosis of Diabetes The following HbA1c ranges recommended by the American Diabetes Association (ADA) may be used as an aid in the diagnosis of diabetes mellitus.  Hemoglobin             Suggested A1C NGSP%              Diagnosis  <5.7                   Non Diabetic  5.7-6.4                Pre-Diabetic  >6.4                   Diabetic  <7.0                   Glycemic control for                       adults with diabetes.           Passed - Cr in normal range and within 360 days    Creat  Date Value Ref Range Status  01/12/2017 0.83 0.70 - 1.33 mg/dL Final    Comment:    For patients >24 years of age, the reference limit for Creatinine is approximately 13% higher for people identified as African-American. .    Creatinine, Ser  Date Value Ref Range Status  08/17/2023 0.85  0.61 - 1.24 mg/dL Final         Passed - Valid encounter within last 6 months    Recent Outpatient Visits           2 months ago Type 2 diabetes mellitus without complication, without long-term current use of insulin  [facility])   [place] [facility] [place] Nancyann BRAVO, MD   4 months ago Influenza vaccine needed   [facility] [place], [place], PA-C   4 months ago Cellulitis of left lower extremity   [place] [facility] [place], [place], PA-C   5 months ago Cellulitis of left lower extremity   [facility] [place] Nancyann BRAVO, MD

## 2023-09-06 ENCOUNTER — Encounter: Payer: Self-pay | Admitting: Podiatry

## 2023-09-06 ENCOUNTER — Ambulatory Visit: Admitting: Podiatry

## 2023-09-06 DIAGNOSIS — Z89412 Acquired absence of left great toe: Secondary | ICD-10-CM

## 2023-09-06 NOTE — Progress Notes (Signed)
  Subjective:  Patient ID: Alejandro Marquez, male    DOB: [birth year],  MRN: [id number]  Chief Complaint  Patient presents with   Routine Post Op    POV #2 Post op-toe amputation    DOS: 08/16/23 Procedure: Left great toe amputation  68 y.o. male returns for post-op check.  Patient states is doing well denies any other acute complaints bandages clean dry and intact no pain.  Ambulating with surgical shoe Dr. Malvin to the surgery  Review of Systems: Negative except as noted in the HPI. Denies N/V/F/Ch.  Past Medical History:  Diagnosis Date   Diabetes mellitus without complication (HCC)    Syncope     Current Outpatient Medications:    acetaminophen  (TYLENOL ) 325 MG tablet, Take 2 tablets (650 mg total) by mouth every 6 (six) hours as needed for mild pain (pain score 1-3) or fever (or Fever >/= 101)., Disp: 90 tablet, Rfl: 0   apixaban  (ELIQUIS ) 5 MG TABS tablet, Take 1 tablet (5 mg total) by mouth 2 (two) times daily. Start taking after completion of starter pack., Disp: 60 tablet, Rfl: 5   ascorbic acid  (VITAMIN C ) 500 MG tablet, Take 1 tablet (500 mg total) by mouth 2 (two) times daily., Disp: 90 tablet, Rfl: 0   Ensure Max Protein (ENSURE MAX PROTEIN) LIQD, Take 330 mLs (11 oz total) by mouth at bedtime., Disp: 20000 mL, Rfl: 0   glipiZIDE  (GLUCOTROL ) 10 MG tablet, TAKE 1 TABLET BY MOUTH TWICE A DAY BEFORE MEALS, Disp: 180 tablet, Rfl: 1   lisinopril  (ZESTRIL ) 10 MG tablet, TAKE 1 TABLET BY MOUTH EVERY DAY, Disp: 90 tablet, Rfl: 1   metFORMIN  (GLUCOPHAGE ) 1000 MG tablet, TAKE 1 TABLET (1,000 MG TOTAL) BY MOUTH TWICE A DAY WITH FOOD, Disp: 180 tablet, Rfl: 3   Multiple Vitamin (MULTIVITAMIN WITH MINERALS) TABS tablet, Take 1 tablet by mouth daily., Disp: 90 tablet, Rfl: 1   nutrition supplement, JUVEN, (JUVEN) PACK, Take 1 packet by mouth 2 (two) times daily between meals., Disp: 180 packet, Rfl: 0   sitaGLIPtin  (JANUVIA ) 100 MG tablet, Take 1 tablet (100 mg total) by mouth  daily. TAKE 1 TABLET BY MOUTH EVERY DAY, Disp: 90 tablet, Rfl: 1  Social History   Tobacco Use  Smoking Status Never  Smokeless Tobacco Never    No Known Allergies Objective:  There were no vitals filed for this visit. There is no height or weight on file to calculate BMI. Constitutional Well developed. Well nourished.  Vascular Foot warm and well perfused. Capillary refill normal to all digits.   Neurologic Normal speech. Oriented to person, place, and time. Epicritic sensation to light touch grossly present bilaterally.  Dermatologic Wound completely epithelialized no signs of dehiscence noted no complication noted.  Orthopedic: Tenderness to palpation noted about the surgical site.   Radiographs: None Assessment:   No diagnosis found.  Plan:  Patient was evaluated and treated and all questions answered.  S/p foot surgery left -Progressing as expected post-operatively. -XR: None -WB Status: Weightbearing as tolerated in surgical shoe -Sutures: Removed yeah I would no clinical signs of dehiscence noted no complication noted -Medications: None -Foot redressed.  No follow-ups on file.

## 2023-09-27 ENCOUNTER — Ambulatory Visit (INDEPENDENT_AMBULATORY_CARE_PROVIDER_SITE_OTHER): Admitting: Podiatry

## 2023-09-27 DIAGNOSIS — Z89412 Acquired absence of left great toe: Secondary | ICD-10-CM

## 2023-09-27 DIAGNOSIS — T8130XA Disruption of wound, unspecified, initial encounter: Secondary | ICD-10-CM

## 2023-09-27 NOTE — Progress Notes (Signed)
  Subjective:  Patient ID: Alejandro Marquez, male    DOB: May 31, 1955,  MRN: [id number]  Chief Complaint  Patient presents with   Routine Post Op    DOS: 08/16/23 Procedure: Left great toe amputation  68 y.o. male returns for post-op check.  Patient states is doing well denies any other acute complaints bandages clean dry and intact no pain.  Ambulating with surgical shoe Dr. Malvin to the surgery  Review of Systems: Negative except as noted in the HPI. Denies N/V/F/Ch.  Past Medical History:  Diagnosis Date   Diabetes mellitus without complication (HCC)    Syncope     Current Outpatient Medications:    acetaminophen  (TYLENOL ) 325 MG tablet, Take 2 tablets (650 mg total) by mouth every 6 (six) hours as needed for mild pain (pain score 1-3) or fever (or Fever >/= 101)., Disp: 90 tablet, Rfl: 0   apixaban  (ELIQUIS ) 5 MG TABS tablet, Take 1 tablet (5 mg total) by mouth 2 (two) times daily. Start taking after completion of starter pack., Disp: 60 tablet, Rfl: 5   ascorbic acid  (VITAMIN C ) 500 MG tablet, Take 1 tablet (500 mg total) by mouth 2 (two) times daily., Disp: 90 tablet, Rfl: 0   Ensure Max Protein (ENSURE MAX PROTEIN) LIQD, Take 330 mLs (11 oz total) by mouth at bedtime., Disp: 20000 mL, Rfl: 0   glipiZIDE  (GLUCOTROL ) 10 MG tablet, TAKE 1 TABLET BY MOUTH TWICE A DAY BEFORE MEALS, Disp: 180 tablet, Rfl: 1   lisinopril  (ZESTRIL ) 10 MG tablet, TAKE 1 TABLET BY MOUTH EVERY DAY, Disp: 90 tablet, Rfl: 1   metFORMIN  (GLUCOPHAGE ) 1000 MG tablet, TAKE 1 TABLET (1,000 MG TOTAL) BY MOUTH TWICE A DAY WITH FOOD, Disp: 180 tablet, Rfl: 3   Multiple Vitamin (MULTIVITAMIN WITH MINERALS) TABS tablet, Take 1 tablet by mouth daily., Disp: 90 tablet, Rfl: 1   nutrition supplement, JUVEN, (JUVEN) PACK, Take 1 packet by mouth 2 (two) times daily between meals., Disp: 180 packet, Rfl: 0   sitaGLIPtin  (JANUVIA ) 100 MG tablet, Take 1 tablet (100 mg total) by mouth daily. TAKE 1 TABLET BY MOUTH EVERY  DAY, Disp: 90 tablet, Rfl: 1  Social History   Tobacco Use  Smoking Status Never  Smokeless Tobacco Never    No Known Allergies Objective:  There were no vitals filed for this visit. There is no height or weight on file to calculate BMI. Constitutional Well developed. Well nourished.  Vascular Foot warm and well perfused. Capillary refill normal to all digits.   Neurologic Normal speech. Oriented to person, place, and time. Epicritic sensation to light touch grossly present bilaterally.  Dermatologic Superficial dehiscence noted without reepithelialization.  Does not probe down to deep tissue or bone.  No purulent drainage noted no malodor present.  Orthopedic: Tenderness to palpation noted about the surgical site.   Radiographs: None Assessment:   1. History of amputation of left great toe (HCC)   2. Wound dehiscence     Plan:  Patient was evaluated and treated and all questions answered.  S/p foot surgery left - Clinically the sutures removed there is superficial dehiscence noted.  Patient would benefit from the wound care center to help assist with closure referral to the wound care center was placed.  I will continue to monitor from clinically every 3 months.  He states understanding.  No follow-ups on file.

## 2023-10-18 ENCOUNTER — Ambulatory Visit: Admitting: Podiatry

## 2023-10-18 DIAGNOSIS — Z89412 Acquired absence of left great toe: Secondary | ICD-10-CM

## 2023-10-18 NOTE — Progress Notes (Signed)
  Subjective:  Patient ID: Alejandro Marquez, male    DOB: 1955-03-25,  MRN: [id number]  Chief Complaint  Patient presents with   Routine Post Op    Pt stated that he is doing well     DOS: 08/16/23 Procedure: Left great toe amputation  68 y.o. male returns for post-op check.  Skin completely epithelialized no signs of dehiscence noted doing well healing well no acute complaints  Review of Systems: Negative except as noted in the HPI. Denies N/V/F/Ch.  Past Medical History:  Diagnosis Date   Diabetes mellitus without complication (HCC)    Syncope     Current Outpatient Medications:    acetaminophen  (TYLENOL ) 325 MG tablet, Take 2 tablets (650 mg total) by mouth every 6 (six) hours as needed for mild pain (pain score 1-3) or fever (or Fever >/= 101)., Disp: 90 tablet, Rfl: 0   apixaban  (ELIQUIS ) 5 MG TABS tablet, Take 1 tablet (5 mg total) by mouth 2 (two) times daily. Start taking after completion of starter pack., Disp: 60 tablet, Rfl: 5   ascorbic acid  (VITAMIN C ) 500 MG tablet, Take 1 tablet (500 mg total) by mouth 2 (two) times daily., Disp: 90 tablet, Rfl: 0   Ensure Max Protein (ENSURE MAX PROTEIN) LIQD, Take 330 mLs (11 oz total) by mouth at bedtime., Disp: 20000 mL, Rfl: 0   glipiZIDE  (GLUCOTROL ) 10 MG tablet, TAKE 1 TABLET BY MOUTH TWICE A DAY BEFORE MEALS, Disp: 180 tablet, Rfl: 1   lisinopril  (ZESTRIL ) 10 MG tablet, TAKE 1 TABLET BY MOUTH EVERY DAY, Disp: 90 tablet, Rfl: 1   metFORMIN  (GLUCOPHAGE ) 1000 MG tablet, TAKE 1 TABLET (1,000 MG TOTAL) BY MOUTH TWICE A DAY WITH FOOD, Disp: 180 tablet, Rfl: 3   Multiple Vitamin (MULTIVITAMIN WITH MINERALS) TABS tablet, Take 1 tablet by mouth daily., Disp: 90 tablet, Rfl: 1   nutrition supplement, JUVEN, (JUVEN) PACK, Take 1 packet by mouth 2 (two) times daily between meals., Disp: 180 packet, Rfl: 0   sitaGLIPtin  (JANUVIA ) 100 MG tablet, Take 1 tablet (100 mg total) by mouth daily. TAKE 1 TABLET BY MOUTH EVERY DAY, Disp: 90 tablet,  Rfl: 1  Social History   Tobacco Use  Smoking Status Never  Smokeless Tobacco Never    No Known Allergies Objective:  There were no vitals filed for this visit. There is no height or weight on file to calculate BMI. Constitutional Well developed. Well nourished.  Vascular Foot warm and well perfused. Capillary refill normal to all digits.   Neurologic Normal speech. Oriented to person, place, and time. Epicritic sensation to light touch grossly present bilaterally.  Dermatologic Skin completely epithelialized no further signs of dehiscence noted no other complication noted.  Orthopedic: Tenderness to palpation noted about the surgical site.   Radiographs: None Assessment:   1. History of amputation of left great toe [facility])      Plan:  Patient was evaluated and treated and all questions answered.  S/p foot surgery left - Clinically healed and officially discharged from my care if any foot and ankle issues or in the future he will come back and see me.  The incision has completely healed.  In case if he opens anything up while he is going back to work I encouraged him to keep his wound care center appointment.  He states understanding.  He is at high risk for further amputation  No follow-ups on file.

## 2023-10-19 ENCOUNTER — Telehealth: Payer: Self-pay | Admitting: Family Medicine

## 2023-10-19 NOTE — Telephone Encounter (Signed)
 Patient advised. Verbalized understanding

## 2023-10-19 NOTE — Telephone Encounter (Signed)
 Please remind patient to STOP taking the eliquis  prescription. We need to do labs for coagulation disorders at his upcoming appointment and he needs to be OFF of eliquis  for a week or two before hand.

## 2023-11-02 ENCOUNTER — Ambulatory Visit: Admitting: Family Medicine

## 2023-11-02 DIAGNOSIS — I82431 Acute embolism and thrombosis of right popliteal vein: Secondary | ICD-10-CM

## 2023-11-05 ENCOUNTER — Encounter: Payer: Self-pay | Admitting: Family Medicine

## 2023-11-05 ENCOUNTER — Ambulatory Visit: Admitting: Physician Assistant

## 2023-11-05 ENCOUNTER — Ambulatory Visit (INDEPENDENT_AMBULATORY_CARE_PROVIDER_SITE_OTHER): Admitting: Family Medicine

## 2023-11-05 VITALS — BP 111/65 | HR 76 | Resp 16 | Ht 71.0 in | Wt 180.0 lb

## 2023-11-05 DIAGNOSIS — I10 Essential (primary) hypertension: Secondary | ICD-10-CM | POA: Diagnosis not present

## 2023-11-05 DIAGNOSIS — Z89412 Acquired absence of left great toe: Secondary | ICD-10-CM

## 2023-11-05 DIAGNOSIS — I82539 Chronic embolism and thrombosis of unspecified popliteal vein: Secondary | ICD-10-CM

## 2023-11-05 DIAGNOSIS — Z7984 Long term (current) use of oral hypoglycemic drugs: Secondary | ICD-10-CM

## 2023-11-05 DIAGNOSIS — E119 Type 2 diabetes mellitus without complications: Secondary | ICD-10-CM | POA: Diagnosis not present

## 2023-11-05 DIAGNOSIS — Z23 Encounter for immunization: Secondary | ICD-10-CM

## 2023-11-05 LAB — POCT GLYCOSYLATED HEMOGLOBIN (HGB A1C): Hemoglobin A1C: 6.9 % — AB (ref 4.0–5.6)

## 2023-11-05 NOTE — Patient Instructions (Signed)
 SABRA  Please review the attached list of medications and notify my office if there are any errors.   . Please bring all of your medications to every appointment so we can make sure that our medication list is the same as yours.

## 2023-11-05 NOTE — Progress Notes (Signed)
 Established patient visit   Patient: Alejandro Marquez   DOB: 01-22-1956   68 y.o. Male  MRN: [id number] Visit Date: 11/05/2023  Today's healthcare provider: Nancyann Perry, MD   Chief Complaint  Patient presents with   Follow-up    4 month f/u.SABRAPt has swelling in feet and legs   Subjective    Discussed the use of AI scribe software for clinical note transcription with the patient, who gave verbal consent to proceed.  History of Present Illness   Alejandro Marquez is a 68 year old male who presents for follow-up after a hypoglycemic episode and foot infection.  In June, he experienced a significant hypoglycemic episode with his blood sugar dropping to 43 mg/dL, and further to 31 mg/dL while in the emergency room. This episode led to hospitalization where he underwent a procedure to amputate his big toe. He was out of work for two weeks following the procedure but has since returned to work. He has not experienced any further episodes of low blood sugar since the hospitalization.  He reports no changes to his medications since the incident. During his hospital stay, he received approximately 20 to 30 insulin  shots. He was previously on Eliquis  for a blood clot but has since stopped taking it. Januvia  costs $100 for a three-month supply.  No recent episodes of lightheadedness or dizziness.   He has also completed 6 month course of Eliquis  for DVT.       Medications: Outpatient Medications Prior to Visit  Medication Sig   acetaminophen  (TYLENOL ) 325 MG tablet Take 2 tablets (650 mg total) by mouth every 6 (six) hours as needed for mild pain (pain score 1-3) or fever (or Fever >/= 101).   apixaban  (ELIQUIS ) 5 MG TABS tablet Take 1 tablet (5 mg total) by mouth 2 (two) times daily. Start taking after completion of starter pack.   ascorbic acid  (VITAMIN C ) 500 MG tablet Take 1 tablet (500 mg total) by mouth 2 (two) times daily.   Ensure Max Protein (ENSURE MAX PROTEIN) LIQD Take  330 mLs (11 oz total) by mouth at bedtime.   glipiZIDE  (GLUCOTROL ) 10 MG tablet TAKE 1 TABLET BY MOUTH TWICE A DAY BEFORE MEALS   lisinopril  (ZESTRIL ) 10 MG tablet TAKE 1 TABLET BY MOUTH EVERY DAY   metFORMIN  (GLUCOPHAGE ) 1000 MG tablet TAKE 1 TABLET (1,000 MG TOTAL) BY MOUTH TWICE A DAY WITH FOOD   Multiple Vitamin (MULTIVITAMIN WITH MINERALS) TABS tablet Take 1 tablet by mouth daily.   nutrition supplement, JUVEN, (JUVEN) PACK Take 1 packet by mouth 2 (two) times daily between meals.   sitaGLIPtin  (JANUVIA ) 100 MG tablet Take 1 tablet (100 mg total) by mouth daily. TAKE 1 TABLET BY MOUTH EVERY DAY   No facility-administered medications prior to visit.   Review of Systems  Constitutional:  Negative for appetite change, chills and fever.  Respiratory:  Negative for chest tightness, shortness of breath and wheezing.   Cardiovascular:  Negative for chest pain and palpitations.  Gastrointestinal:  Negative for abdominal pain, nausea and vomiting.       Objective    BP 111/65 (BP Location: Left Arm, Patient Position: Sitting, Cuff Size: Normal)   Pulse 76   Resp 16   Ht 5' 11 (1.803 m)   Wt 180 lb (81.6 kg)   SpO2 98%   BMI 25.10 kg/m   Physical Exam   General appearance: Well developed, well nourished male, cooperative and in no acute distress  Head: Normocephalic, without obvious abnormality, atraumatic Respiratory: Respirations even and unlabored, normal respiratory rate Extremities: Left 1st toe amputated.  Skin: Skin color, texture, turgor normal. No rashes seen  Psych: Appropriate mood and affect. Neurologic: Mental status: Alert, oriented to person, place, and time, thought content appropriate.    A1c=6.9 Assessment & Plan    1. Diabetes mellitus without complication (HCC) (Primary) Well controlled on current medications.   2. 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   - Lupus  anticoagulant panel - Factor 5 leiden - CBC - PT and PTT - Protein C, total and functional panel - Protein S, total and functional panel - Cardiolipin antibodies, IgM+IgG - Protein C, total - Protein S, total  3. History of amputation of left great toe [facility]) Doing well post operatively. Reports no trouble with balance.   4. Essential hypertension Well controlled.  Continue current medications.    5. Need for influenza vaccination  - Flu vaccine HIGH DOSE PF(Fluzone Trivalent)   Return in about 4 months (around 03/06/2024) for Diabetes.     Nancyann Perry, MD  [facility] [phone] (phone) [phone] (fax)  [facility]

## 2023-11-09 LAB — PT AND PTT
INR: 1 (ref 0.9–1.2)
Prothrombin Time: 11 s (ref 9.1–12.0)
aPTT: 26 s (ref 24–33)

## 2023-11-09 LAB — CARDIOLIPIN ANTIBODIES, IGM+IGG
Anticardiolipin IgG: <9 GPL U/mL (ref 0–14)
Anticardiolipin IgM: <9 [MPL'U]/mL (ref 0–12)

## 2023-11-09 LAB — LUPUS ANTICOAGULANT PANEL
Dilute Viper Venom Time: 43.5 s (ref 0.0–47.0)
PTT Lupus Anticoagulant: 34 s (ref 0.0–43.5)

## 2023-11-09 LAB — CBC
Hematocrit: 41.3 % (ref 37.5–51.0)
Hemoglobin: 13.9 g/dL (ref 13.0–17.7)
MCH: 31.7 pg (ref 26.6–33.0)
MCHC: 33.7 g/dL (ref 31.5–35.7)
MCV: 94 fL (ref 79–97)
Platelets: 115 x10E3/uL — ABNORMAL LOW (ref 150–450)
RBC: 4.38 x10E6/uL (ref 4.14–5.80)
RDW: 13.9 % (ref 11.6–15.4)
WBC: 3.9 x10E3/uL (ref 3.4–10.8)

## 2023-11-09 LAB — PROTEIN C, TOTAL: Protein C Antigen: 76 % (ref 60–150)

## 2023-11-09 LAB — FACTOR 5 LEIDEN

## 2023-11-09 LAB — PROTEIN S, TOTAL: Protein S Ag, Total: 79 % (ref 60–150)

## 2023-11-16 ENCOUNTER — Encounter: Payer: Self-pay | Admitting: Family Medicine

## 2023-11-16 ENCOUNTER — Ambulatory Visit: Payer: Self-pay | Admitting: Family Medicine

## 2023-11-16 DIAGNOSIS — D696 Thrombocytopenia, unspecified: Secondary | ICD-10-CM | POA: Insufficient documentation

## 2023-11-28 DIAGNOSIS — E119 Type 2 diabetes mellitus without complications: Secondary | ICD-10-CM | POA: Diagnosis not present

## 2023-11-28 DIAGNOSIS — H5203 Hypermetropia, bilateral: Secondary | ICD-10-CM | POA: Diagnosis not present

## 2023-12-18 DIAGNOSIS — D485 Neoplasm of uncertain behavior of skin: Secondary | ICD-10-CM | POA: Diagnosis not present

## 2023-12-18 DIAGNOSIS — L719 Rosacea, unspecified: Secondary | ICD-10-CM | POA: Diagnosis not present

## 2023-12-18 DIAGNOSIS — L711 Rhinophyma: Secondary | ICD-10-CM | POA: Diagnosis not present

## 2023-12-18 DIAGNOSIS — R208 Other disturbances of skin sensation: Secondary | ICD-10-CM | POA: Diagnosis not present

## 2023-12-18 DIAGNOSIS — L538 Other specified erythematous conditions: Secondary | ICD-10-CM | POA: Diagnosis not present

## 2023-12-29 ENCOUNTER — Other Ambulatory Visit: Payer: Self-pay | Admitting: Family Medicine

## 2023-12-29 DIAGNOSIS — E119 Type 2 diabetes mellitus without complications: Secondary | ICD-10-CM

## 2024-01-19 ENCOUNTER — Other Ambulatory Visit: Payer: Self-pay | Admitting: Family Medicine

## 2024-01-19 DIAGNOSIS — I10 Essential (primary) hypertension: Secondary | ICD-10-CM

## 2024-02-22 ENCOUNTER — Encounter: Payer: Self-pay | Admitting: Family Medicine

## 2024-03-05 ENCOUNTER — Ambulatory Visit: Admitting: Family Medicine

## 2024-03-24 ENCOUNTER — Ambulatory Visit: Admitting: Family Medicine

## 2024-03-24 DIAGNOSIS — D696 Thrombocytopenia, unspecified: Secondary | ICD-10-CM

## 2024-03-24 DIAGNOSIS — N529 Male erectile dysfunction, unspecified: Secondary | ICD-10-CM

## 2024-03-24 DIAGNOSIS — E1169 Type 2 diabetes mellitus with other specified complication: Secondary | ICD-10-CM

## 2024-03-24 DIAGNOSIS — K769 Liver disease, unspecified: Secondary | ICD-10-CM

## 2024-03-24 DIAGNOSIS — Z86718 Personal history of other venous thrombosis and embolism: Secondary | ICD-10-CM

## 2024-03-24 DIAGNOSIS — L97522 Non-pressure chronic ulcer of other part of left foot with fat layer exposed: Secondary | ICD-10-CM

## 2024-03-24 DIAGNOSIS — R7401 Elevation of levels of liver transaminase levels: Secondary | ICD-10-CM

## 2024-03-24 DIAGNOSIS — E119 Type 2 diabetes mellitus without complications: Secondary | ICD-10-CM

## 2024-03-24 DIAGNOSIS — Z1211 Encounter for screening for malignant neoplasm of colon: Secondary | ICD-10-CM

## 2024-03-24 DIAGNOSIS — I10 Essential (primary) hypertension: Secondary | ICD-10-CM

## 2024-04-09 ENCOUNTER — Ambulatory Visit: Admitting: Family Medicine
